# Patient Record
Sex: Female | Born: 1960 | Race: Black or African American | Hispanic: No | Marital: Married | Smoking: Never smoker
Health system: Southern US, Community
[De-identification: ages and names within clinical notes are randomized; demographics above are authoritative.]

## PROBLEM LIST (undated history)

## (undated) DIAGNOSIS — D649 Anemia, unspecified: Secondary | ICD-10-CM

## (undated) DIAGNOSIS — R7303 Prediabetes: Secondary | ICD-10-CM

## (undated) DIAGNOSIS — I10 Essential (primary) hypertension: Secondary | ICD-10-CM

## (undated) HISTORY — DX: Anemia, unspecified: D64.9

## (undated) HISTORY — DX: Essential (primary) hypertension: I10

---

## 1999-11-24 HISTORY — PX: OTHER SURGICAL HISTORY: SHX169

## 1999-11-24 HISTORY — PX: CYST EXCISION: SHX5701

## 2005-02-22 ENCOUNTER — Emergency Department: Payer: Self-pay | Admitting: General Practice

## 2005-04-22 ENCOUNTER — Ambulatory Visit: Payer: Self-pay | Admitting: Obstetrics and Gynecology

## 2006-04-30 ENCOUNTER — Ambulatory Visit: Payer: Self-pay | Admitting: Obstetrics and Gynecology

## 2007-07-01 ENCOUNTER — Ambulatory Visit: Payer: Self-pay | Admitting: Obstetrics and Gynecology

## 2008-07-18 ENCOUNTER — Ambulatory Visit: Payer: Self-pay | Admitting: Obstetrics and Gynecology

## 2008-09-06 DIAGNOSIS — R519 Headache, unspecified: Secondary | ICD-10-CM | POA: Insufficient documentation

## 2008-09-08 ENCOUNTER — Ambulatory Visit: Payer: Self-pay | Admitting: Neurology

## 2009-09-17 ENCOUNTER — Ambulatory Visit: Payer: Self-pay | Admitting: Obstetrics and Gynecology

## 2010-04-16 DIAGNOSIS — R928 Other abnormal and inconclusive findings on diagnostic imaging of breast: Secondary | ICD-10-CM | POA: Insufficient documentation

## 2010-04-28 ENCOUNTER — Ambulatory Visit: Payer: Self-pay | Admitting: Family Medicine

## 2010-09-18 ENCOUNTER — Ambulatory Visit: Payer: Self-pay | Admitting: Obstetrics and Gynecology

## 2011-04-07 ENCOUNTER — Emergency Department: Payer: Self-pay | Admitting: Emergency Medicine

## 2011-09-22 ENCOUNTER — Ambulatory Visit: Payer: Self-pay | Admitting: Obstetrics and Gynecology

## 2012-06-24 ENCOUNTER — Emergency Department: Payer: Self-pay | Admitting: Emergency Medicine

## 2012-06-24 LAB — CBC
MCH: 33.6 pg (ref 26.0–34.0)
MCHC: 35.6 g/dL (ref 32.0–36.0)
MCV: 95 fL (ref 80–100)
Platelet: 218 10*3/uL (ref 150–440)
RDW: 12.3 % (ref 11.5–14.5)

## 2012-06-24 LAB — COMPREHENSIVE METABOLIC PANEL
Anion Gap: 6 — ABNORMAL LOW (ref 7–16)
Bilirubin,Total: 0.3 mg/dL (ref 0.2–1.0)
Chloride: 105 mmol/L (ref 98–107)
Co2: 29 mmol/L (ref 21–32)
Creatinine: 0.86 mg/dL (ref 0.60–1.30)
EGFR (African American): >60
EGFR (Non-African Amer.): >60
Glucose: 84 mg/dL (ref 65–99)
Osmolality: 278 (ref 275–301)
SGOT(AST): 40 U/L — ABNORMAL HIGH (ref 15–37)
Sodium: 140 mmol/L (ref 136–145)

## 2012-09-22 ENCOUNTER — Ambulatory Visit: Payer: Self-pay | Admitting: Obstetrics and Gynecology

## 2012-12-30 ENCOUNTER — Ambulatory Visit: Payer: Self-pay | Admitting: Gastroenterology

## 2012-12-30 LAB — HM COLONOSCOPY: HM COLON: NORMAL

## 2013-03-10 ENCOUNTER — Ambulatory Visit: Payer: Self-pay | Admitting: Family Medicine

## 2013-09-26 ENCOUNTER — Ambulatory Visit: Payer: Self-pay | Admitting: Obstetrics and Gynecology

## 2014-06-16 ENCOUNTER — Inpatient Hospital Stay: Payer: Self-pay | Admitting: Internal Medicine

## 2014-06-16 LAB — COMPREHENSIVE METABOLIC PANEL
ALK PHOS: 75 U/L
AST: 23 U/L (ref 15–37)
Albumin: 3.4 g/dL (ref 3.4–5.0)
Anion Gap: 6 — ABNORMAL LOW (ref 7–16)
BILIRUBIN TOTAL: 0.3 mg/dL (ref 0.2–1.0)
BUN: 12 mg/dL (ref 7–18)
CALCIUM: 9.2 mg/dL (ref 8.5–10.1)
CHLORIDE: 105 mmol/L (ref 98–107)
CO2: 30 mmol/L (ref 21–32)
CREATININE: 1.15 mg/dL (ref 0.60–1.30)
GFR CALC NON AF AMER: 54 — AB
Glucose: 87 mg/dL (ref 65–99)
Osmolality: 280 (ref 275–301)
Potassium: 3.5 mmol/L (ref 3.5–5.1)
SGPT (ALT): 22 U/L
Sodium: 141 mmol/L (ref 136–145)
TOTAL PROTEIN: 7.6 g/dL (ref 6.4–8.2)

## 2014-06-16 LAB — CBC
HCT: 37.6 % (ref 35.0–47.0)
HGB: 12.4 g/dL (ref 12.0–16.0)
MCH: 31.5 pg (ref 26.0–34.0)
MCHC: 33 g/dL (ref 32.0–36.0)
MCV: 95 fL (ref 80–100)
PLATELETS: 209 10*3/uL (ref 150–440)
RBC: 3.94 10*6/uL (ref 3.80–5.20)
RDW: 12.4 % (ref 11.5–14.5)
WBC: 5.4 10*3/uL (ref 3.6–11.0)

## 2014-06-16 LAB — CK TOTAL AND CKMB (NOT AT ARMC)
CK, TOTAL: 89 U/L
CK, Total: 108 U/L
CK-MB: <0.5 ng/mL — ABNORMAL LOW (ref 0.5–3.6)
CK-MB: 0.6 ng/mL (ref 0.5–3.6)

## 2014-06-16 LAB — APTT: Activated PTT: 28.6 secs (ref 23.6–35.9)

## 2014-06-16 LAB — TROPONIN I
TROPONIN-I: 0.09 ng/mL — AB
TROPONIN-I: 0.1 ng/mL — AB
TROPONIN-I: 0.09 ng/mL — AB

## 2014-06-16 LAB — PROTIME-INR
INR: 1
PROTHROMBIN TIME: 13 s (ref 11.5–14.7)

## 2014-06-17 LAB — BASIC METABOLIC PANEL
Anion Gap: 7 (ref 7–16)
BUN: 14 mg/dL (ref 7–18)
CO2: 29 mmol/L (ref 21–32)
CREATININE: 1.22 mg/dL (ref 0.60–1.30)
Calcium, Total: 8.8 mg/dL (ref 8.5–10.1)
Chloride: 104 mmol/L (ref 98–107)
EGFR (African American): 59 — ABNORMAL LOW
EGFR (Non-African Amer.): 51 — ABNORMAL LOW
Glucose: 91 mg/dL (ref 65–99)
Osmolality: 279 (ref 275–301)
Potassium: 3.6 mmol/L (ref 3.5–5.1)
Sodium: 140 mmol/L (ref 136–145)

## 2014-06-17 LAB — CBC WITH DIFFERENTIAL/PLATELET
Basophil #: 0 10*3/uL (ref 0.0–0.1)
Basophil %: 0.4 %
EOS ABS: 0.1 10*3/uL (ref 0.0–0.7)
EOS PCT: 1.8 %
HCT: 35.7 % (ref 35.0–47.0)
HGB: 12.3 g/dL (ref 12.0–16.0)
LYMPHS PCT: 36.9 %
Lymphocyte #: 3 10*3/uL (ref 1.0–3.6)
MCH: 32.6 pg (ref 26.0–34.0)
MCHC: 34.5 g/dL (ref 32.0–36.0)
MCV: 94 fL (ref 80–100)
MONO ABS: 0.6 x10 3/mm (ref 0.2–0.9)
Monocyte %: 7.5 %
Neutrophil #: 4.4 10*3/uL (ref 1.4–6.5)
Neutrophil %: 53.4 %
Platelet: 212 10*3/uL (ref 150–440)
RBC: 3.79 10*6/uL — AB (ref 3.80–5.20)
RDW: 12.5 % (ref 11.5–14.5)
WBC: 8.2 10*3/uL (ref 3.6–11.0)

## 2014-06-17 LAB — CK TOTAL AND CKMB (NOT AT ARMC)
CK, Total: 86 U/L
CK-MB: <0.5 ng/mL — ABNORMAL LOW (ref 0.5–3.6)

## 2014-06-17 LAB — TROPONIN I: TROPONIN-I: 0.09 ng/mL — AB

## 2014-10-08 ENCOUNTER — Ambulatory Visit: Payer: Self-pay | Admitting: Obstetrics and Gynecology

## 2014-10-08 LAB — HM MAMMOGRAPHY

## 2015-03-16 NOTE — Discharge Summary (Signed)
PATIENT NAME:  Caroline Bowen, Caroline Bowen MR#:  [id number] DATE OF BIRTH:  05-21-1961  DATE OF ADMISSION:  06/16/2014 DATE OF DISCHARGE:  06/18/2014  PRESENTING COMPLAINT: Chest pain.   DISCHARGE DIAGNOSES:  1. Chest pain, appeared atypical, could be due to gastroesophageal reflux disease, resolved.  2. Cardiac catheterization with normal coronaries.   PROCEDURES: Cardiac catheterization with normal coronaries and normal ejection fraction.   CODE STATUS: Full Code.   MEDICATIONS:  1. Omeprazole 20 mg daily.  2. Metoprolol 25 mg daily.  3. Hydroxyzine hydrochloride 25 mg 1 tablet every 4 to 6 hours as needed.  4. Hydrochlorothiazide/triamterene 25/37.5 half-a-tablet daily.   FOLLOW-UP:  1. With Dr. Elease HashimotoMaloney in 1 to 2 weeks. 2. Follow up with Dr. Adrian BlackwaterShaukat Khan in 2 weeks.   LABORATORY DATA: Echocardiogram/Doppler showed ejection fraction of 55% to 60%. Normal wall motion, decreased left ventricular internal cavity size, normal right ventricular size and systolic function, left atrium and right atrium are normal in size and structure.   CBC within normal limits. Basic metabolic panel within normal limits. Troponin is 0.09, 0.09, and 0.10.   CONSULTS: Cardiology consultation by Dr. Adrian BlackwaterShaukat Khan.   BRIEF SUMMARY OF HOSPITAL COURSE: [name] is a 54 year old African American female with history of hypertension, comes in with chest pain and elevated mild troponin. She was admitted with:   1. Chest pain with mild elevated troponin. Admission Diagnosis is non-STEMI; however, it was ruled out after she was found to have normal coronaries on cardiac catheterization. The patient was chest pain-free and hemodynamically stable, okay from cardiology standpoint to be discharged. She was discharged on Toprol-XL and triamterene/hydrochlorothiazide along with a baby aspirin.  2. Hypertension. Continued hydrochlorothiazide, added Toprol-XL.  3. Possible GERD, advised over-the-counter Prilosec.   Hospital stay  otherwise remained stable.   TIME SPENT: 40 minutes.    ____________________________ Wylie HailSona A. Allena KatzPatel, MD sap:jr D: 06/19/2014 15:05:47 ET T: 06/19/2014 16:16:31 ET JOB#: [id number]  cc: Glee Lashomb A. Allena KatzPatel, MD, <Dictator> Leo GrosserNancy J. Maloney, MD Willow OraSONA A Braylei Totino MD ELECTRONICALLY SIGNED 07/04/2014 11:33

## 2015-03-16 NOTE — H&P (Signed)
PATIENT NAME:  Caroline Bowen, Caroline Bowen MR#:  [id number] DATE OF BIRTH:  [birth year]  DATE OF ADMISSION:  06/16/2014  REFERRING PHYSICIAN: Dr. Dierdre Searles   FAMILY PHYSICIAN: Leo Grosser, MD  REASON FOR ADMISSION: Chest pain with elevated troponin.   HISTORY OF PRESENT ILLNESS: The patient is a 54 year old female with a history of hypertension, who presents to the Emergency Room with burning sensation in her chest and neck. Some left-sided headache. Denies previous cardiac history. Pain was 7 out of 10 at its worst. No radiation of the pain. It was nonexertional. She woke up with the discomfort this morning. Denies any associated nausea, vomiting, diaphoresis or shortness of breath. No palpitations. In the Emergency Room, the patient was noted to be mildly hypertensive with T wave inversion noted on EKG. Her troponin is mildly elevated at 0.09. She is now admitted for further evaluation.   PAST MEDICAL HISTORY:  1. Benign hypertension.  2. Chronic pruritus.  3. Menopausal syndrome.   MEDICATIONS:  1. Triamterene/hydrochlorothiazide 37.5/25 mg 1/2-tablet p.o. daily.  2. Hydroxyzine 25 mg p.o. q.6 hours p.r.n. itching.  3. CombiPatch 1 topically 2 times per week.   ALLERGIES: No known drug allergies.   SOCIAL HISTORY: The patient is married with a 79 year old son. Works for [employer] as a Engineer, materials. No history of alcohol or tobacco abuse.   FAMILY HISTORY: Positive for congestive heart failure in her father and coronary artery disease in her paternal grandfather. Also positive for hypertension. Negative for colon or breast cancer.   REVIEW OF SYSTEMS:  CONSTITUTIONAL: No fever or change in weight.  EYES: No blurred or double vision. No glaucoma.  ENT: No tinnitus or hearing loss. No nasal discharge or bleeding. No difficulty swallowing.  RESPIRATORY: No cough or wheezing. Denies hemoptysis.  CARDIOVASCULAR: No orthopnea or palpitations. No syncope.  GASTROINTESTINAL: No  nausea, vomiting or diarrhea. No abdominal pain. No change in bowel habits.  GENITOURINARY: No dysuria or hematuria. No incontinence.  ENDOCRINE: No polyuria or polydipsia. No heat or cold intolerance.  HEMATOLOGIC: The patient denies anemia, easy bruising or bleeding.  LYMPHATIC: No swollen glands.  MUSCULOSKELETAL: The patient denies pain in her neck, back, shoulders, knees or hips. No gout.  NEUROLOGIC: No numbness or migraines. Denies stroke or seizures.  PSYCHIATRIC: The patient denies anxiety, insomnia or depression.   PHYSICAL EXAMINATION:  GENERAL: The patient is in no acute distress.  VITAL SIGNS: Currently remarkable for a blood pressure of 159/98, heart rate 72, respiratory rate 20, temperature 98.2, saturation 98% on room air.  HEENT: Normocephalic, atraumatic. Pupils equally round and reactive to light and accommodation. Extraocular movements are intact. Sclerae are nonicteric. Conjunctivae are clear. Oropharynx is clear.  NECK: Supple without JVD. No adenopathy or thyromegaly is noted.  LUNGS: Clear to auscultation and percussion without wheezes, rales or rhonchi. No dullness. Respiratory effort is normal.  CARDIAC: Regular rate and rhythm with a normal S1 and S2. No significant rubs, murmurs or gallops. PMI is nondisplaced. Chest wall is nontender.  ABDOMEN: Soft, nontender, with normoactive bowel sounds. No organomegaly or masses were appreciated. No hernias or bruits were noted.  EXTREMITIES: Without clubbing, cyanosis, edema. Pulses were 2+ bilaterally.  SKIN: Warm and dry, without rash or lesions.  NEUROLOGIC: Cranial nerves II through XII grossly intact. Deep tendon reflexes are symmetric. Motor and sensory exam nonfocal.  PSYCHIATRIC: Revealed a patient who was alert and oriented to person, place and time. She was cooperative and used good judgment.   LABORATORY  DATA: EKG revealed sinus rhythm with T wave inversion inferolaterally. Chest x-ray was unremarkable. Her troponin  was 0.09. Glucose 87 with a BUN of 12, creatinine 1.15 and a sodium of 141 with a potassium of 3.5 and a GFR of greater than 60. Her white count was 5.4 with a hemoglobin of 12.4.   ASSESSMENT:  1. Atypical chest pain.  2. Elevated troponin.  3. Abnormal electrocardiogram.  4. Benign hypertension.   PLAN: The patient will be observed on telemetry with aspirin and subcutaneous Lovenox. Will follow serial cardiac enzymes and obtain an echocardiogram. Will obtain a cardiology consult because of the elevation of her troponin. Follow up routine labs in the morning. Will monitor her blood pressure closely. Further treatment and evaluation will depend upon the patient's progress.   TOTAL TIME SPENT ON THIS PATIENT: 45 minutes.   ____________________________ Duane LopeJeffrey D. Judithann SheenSparks, MD jds:lb D: 06/16/2014 12:55:46 ET T: 06/16/2014 13:24:33 ET JOB#: [id number]  cc: Duane LopeJeffrey D. Judithann SheenSparks, MD, <Dictator> Leo GrosserNancy J. Maloney, MD Makalia Bare Rodena Medin Chauncey Bruno MD ELECTRONICALLY SIGNED 06/17/2014 10:59

## 2015-03-16 NOTE — Consult Note (Signed)
PATIENT NAME:  Caroline Bowen, DIANE B MR#:  [id number] DATE OF BIRTH:  Apr 11, 1961  DATE OF CONSULTATION:  06/16/2014  REFERRING PHYSICIAN:   CONSULTING PHYSICIAN:  Laurier NancyShaukat A. Klarissa Mcilvain, MD  INDICATION FOR CONSULTATION: Chest pain.   HISTORY OF PRESENT ILLNESS: This is a 54 year old African American female with a past medical history of hypertension who presented to the Emergency Room with left-sided pressure-type chest pain associated with shortness of breath and diaphoresis and headache. She had 7/10 chest pain lasting 45 minutes. Her EKG was abnormal along with a mildly elevated troponin, thus I was asked to evaluate the patient. She had, along with chest pain, left-sided headaches. Chest pain is gone right now.   PAST MEDICAL HISTORY:  1.  History of hypertension.  2.  History of menopause. 3.  History of chronic pruritus.   MEDICATIONS: Triamterene/hydrochlorothiazide 37.5/25 half tablet once a day, CombiPatch, and hydroxyzine.   ALLERGIES: NONE.   SOCIAL HISTORY: No history of EtOH abuse or smoking.   FAMILY HISTORY: Positive for coronary artery disease and CHF.   PHYSICAL EXAMINATION:  GENERAL: She is alert and oriented x 3 in no acute distress.  VITAL SIGNS: Stable.  NECK: No JVD.  LUNGS: Clear.  HEART: Regular rate and rhythm. Normal S1, S2. No audible murmur.  ABDOMEN: Soft, nontender, positive bowel sounds.  EXTREMITIES: No pedal edema.  NEUROLOGIC: The patient appears to be intact.   DIAGNOSTIC STUDIES: Her EKG shows normal sinus rhythm, 65 beats per minute, T wave inversion in to V3-V6 and leads II, III, aVF.  Chest pain is gone now.   ASSESSMENT AND PLAN: The patient probably has features of non-ST segment elevation  myocardial infarction/unstable angina with risk factors of hypertension, menopause, and family history of coronary artery disease, abnormal EKG, and mildly elevated troponin. We will start the patient on Plavix with loading dose of 300 and then 75 once a day, and will  set up for cardiac catheterization Monday.   Thank you very much for the referral.    ____________________________ Laurier NancyShaukat A. Dustine Bertini, MD sak:ts D: 06/16/2014 14:23:24 ET T: 06/16/2014 14:39:25 ET JOB#: [id number]  cc: Laurier NancyShaukat A. Graiden Henes, MD, <Dictator> Laurier NancySHAUKAT A Virginia Francisco MD ELECTRONICALLY SIGNED 06/28/2014 12:02

## 2015-05-24 DIAGNOSIS — D649 Anemia, unspecified: Secondary | ICD-10-CM | POA: Insufficient documentation

## 2015-05-24 DIAGNOSIS — Z862 Personal history of diseases of the blood and blood-forming organs and certain disorders involving the immune mechanism: Secondary | ICD-10-CM | POA: Insufficient documentation

## 2015-05-24 DIAGNOSIS — R609 Edema, unspecified: Secondary | ICD-10-CM | POA: Insufficient documentation

## 2015-06-05 ENCOUNTER — Ambulatory Visit (INDEPENDENT_AMBULATORY_CARE_PROVIDER_SITE_OTHER): Payer: BLUE CROSS/BLUE SHIELD | Admitting: Family Medicine

## 2015-06-05 ENCOUNTER — Encounter: Payer: Self-pay | Admitting: Family Medicine

## 2015-06-05 VITALS — BP 116/70 | HR 88 | Temp 98.6°F | Resp 16 | Ht 67.0 in | Wt 173.0 lb

## 2015-06-05 DIAGNOSIS — I10 Essential (primary) hypertension: Secondary | ICD-10-CM

## 2015-06-05 DIAGNOSIS — N959 Unspecified menopausal and perimenopausal disorder: Secondary | ICD-10-CM | POA: Diagnosis not present

## 2015-06-05 MED ORDER — ESTRADIOL-NORETHINDRONE ACET 0.05-0.14 MG/DAY TD PTTW: 1.0000 | MEDICATED_PATCH | TRANSDERMAL | Status: DC

## 2015-06-05 MED ORDER — TRIAMTERENE-HCTZ 37.5-25 MG PO TABS: 1.0000 | ORAL_TABLET | Freq: Every day | ORAL | Status: DC

## 2015-06-05 NOTE — Progress Notes (Signed)
Subjective:    Patient ID: Caroline Bowen, female    DOB: 05/12/1961, 54 y.o.   MRN: [id number]  Hypertension This is a chronic problem. The problem is controlled (Pt currently taking Maxzide 37.5-25 mg). Pertinent negatives include no anxiety, blurred vision, chest pain, headaches, malaise/fatigue, neck pain, orthopnea, palpitations, peripheral edema, shortness of breath or sweats. The current treatment provides moderate improvement. There are no compliance problems.   Pt is not checking BP outside of the office.  Menopause Symptoms Pt is requesting refill on Combipatch. Is helping her symptoms.   Mom has been sick. Did finish chemo. Is doing better. Unclear of prognosis.    Review of Systems  Constitutional: Negative.  Negative for malaise/fatigue.  Eyes: Negative for blurred vision.  Respiratory: Negative for shortness of breath.   Cardiovascular: Negative for chest pain, palpitations and orthopnea.  Musculoskeletal: Negative for neck pain.  Neurological: Negative for headaches.   BP 116/70 mmHg  Pulse 88  Temp(Src) 98.6 F (37 C) (Oral)  Resp 16  Ht 5\' 7"  (1.702 m)  Wt 173 lb (78.472 kg)  BMI 27.09 kg/m2   Patient Active Problem List   Diagnosis Date Noted  . Absolute anemia 05/24/2015  . Accumulation of fluid in tissues 05/24/2015  . Abnormal finding on breast imaging 04/16/2010  . Benign hypertension 09/06/2008  . Headache disorder 09/06/2008   No past medical history on file. Current Outpatient Prescriptions on File Prior to Visit  Medication Sig  . estradiol-norethindrone (COMBIPATCH) 0.05-0.14 MG/DAY Place onto the skin.  Marland Kitchen. triamterene-hydrochlorothiazide (MAXZIDE-25) 37.5-25 MG per tablet Take by mouth.   No current facility-administered medications on file prior to visit.   Allergies  Allergen Reactions  . Metoprolol Swelling   Past Surgical History  Procedure Laterality Date  . Cyst excision  2001    wrist   History   Social History  . Marital  Status: Married    Spouse Name: N/A  . Number of Children: N/A  . Years of Education: N/A   Occupational History  . Not on file.   Social History Main Topics  . Smoking status: Never Smoker   . Smokeless tobacco: Never Used  . Alcohol Use: No  . Drug Use: No  . Sexual Activity: Not on file   Other Topics Concern  . Not on file   Social History Narrative   Family History  Problem Relation Age of Onset  . Hypertension Mother   . Hypertension Father   . Diabetes Father   . Kidney disease Father   . Prostate cancer Father   . Heart failure Father   . Diabetes Sister   . Hypertension Sister   . Hypertension Brother   . Hypertension Sister   . Pancreatic cancer Brother        Objective:   Physical Exam  Constitutional: She is oriented to person, place, and time. She appears well-developed and well-nourished.  Neurological: She is alert and oriented to person, place, and time.    BP 116/70 mmHg  Pulse 88  Temp(Src) 98.6 F (37 C) (Oral)  Resp 16  Ht 5\' 7"  (1.702 m)  Wt 173 lb (78.472 kg)  BMI 27.09 kg/m2      Assessment & Plan:  1. Benign hypertension Stable. Continue to take medication as prescribed.   - CBC with Differential/Platelet - Comprehensive metabolic panel - TSH - triamterene-hydrochlorothiazide (MAXZIDE-25) 37.5-25 MG per tablet; Take 1 tablet by mouth daily. Patient currently take 1/2 day.  Dispense: 30 tablet;  Refill: 5  2. Menopausal disorder Symptoms controlled. Continue current medication.  No side effects noted.   - estradiol-norethindrone (COMBIPATCH) 0.05-0.14 MG/DAY; Place 1 patch onto the skin 2 (two) times a week.  Dispense: 8 patch; Refill: 5  3. For mood- Dealing with illness of her mother.   Currently both are doing ok. Will address further as needed.  Lorie Phenix, MD

## 2015-06-07 ENCOUNTER — Telehealth: Payer: Self-pay

## 2015-06-07 LAB — COMPREHENSIVE METABOLIC PANEL
A/G RATIO: 1.8 (ref 1.1–2.5)
ALBUMIN: 4.4 g/dL (ref 3.5–5.5)
ALT: 13 IU/L (ref 0–32)
AST: 19 IU/L (ref 0–40)
Alkaline Phosphatase: 73 IU/L (ref 39–117)
BUN / CREAT RATIO: 12 (ref 9–23)
BUN: 12 mg/dL (ref 6–24)
Bilirubin Total: 0.3 mg/dL (ref 0.0–1.2)
CALCIUM: 9.8 mg/dL (ref 8.7–10.2)
CO2: 25 mmol/L (ref 18–29)
CREATININE: 1.03 mg/dL — AB (ref 0.57–1.00)
Chloride: 101 mmol/L (ref 97–108)
GFR calc non Af Amer: 62 mL/min/{1.73_m2} (ref >59)
GFR, EST AFRICAN AMERICAN: 71 mL/min/{1.73_m2} (ref >59)
Globulin, Total: 2.5 g/dL (ref 1.5–4.5)
Glucose: 91 mg/dL (ref 65–99)
POTASSIUM: 4.3 mmol/L (ref 3.5–5.2)
Sodium: 141 mmol/L (ref 134–144)
Total Protein: 6.9 g/dL (ref 6.0–8.5)

## 2015-06-07 LAB — CBC WITH DIFFERENTIAL/PLATELET
Basophils Absolute: 0 10*3/uL (ref 0.0–0.2)
Basos: 0 %
EOS (ABSOLUTE): 0.1 10*3/uL (ref 0.0–0.4)
Eos: 3 %
HEMATOCRIT: 37.8 % (ref 34.0–46.6)
HEMOGLOBIN: 12.8 g/dL (ref 11.1–15.9)
Immature Grans (Abs): 0 10*3/uL (ref 0.0–0.1)
Immature Granulocytes: 0 %
LYMPHS ABS: 2.2 10*3/uL (ref 0.7–3.1)
Lymphs: 43 %
MCH: 31.8 pg (ref 26.6–33.0)
MCHC: 33.9 g/dL (ref 31.5–35.7)
MCV: 94 fL (ref 79–97)
MONOCYTES: 9 %
MONOS ABS: 0.5 10*3/uL (ref 0.1–0.9)
Neutrophils Absolute: 2.3 10*3/uL (ref 1.4–7.0)
Neutrophils: 45 %
PLATELETS: 228 10*3/uL (ref 150–379)
RBC: 4.02 x10E6/uL (ref 3.77–5.28)
RDW: 13.4 % (ref 12.3–15.4)
WBC: 5.1 10*3/uL (ref 3.4–10.8)

## 2015-06-07 LAB — TSH: TSH: 2.43 u[IU]/mL (ref 0.450–4.500)

## 2015-06-07 NOTE — Telephone Encounter (Signed)
Pt advised as directed below.   Thanks,   -Barett Whidbee  

## 2015-06-07 NOTE — Telephone Encounter (Signed)
-----   Message from Lorie PhenixNancy Maloney, MD sent at 06/07/2015  8:38 AM EDT ----- Labs stable. Please notify patient. Thanks.

## 2015-06-28 ENCOUNTER — Other Ambulatory Visit: Payer: Self-pay | Admitting: Family Medicine

## 2015-06-28 DIAGNOSIS — I10 Essential (primary) hypertension: Secondary | ICD-10-CM

## 2015-09-09 ENCOUNTER — Ambulatory Visit (INDEPENDENT_AMBULATORY_CARE_PROVIDER_SITE_OTHER): Payer: BLUE CROSS/BLUE SHIELD | Admitting: Family Medicine

## 2015-09-09 ENCOUNTER — Encounter: Payer: Self-pay | Admitting: Family Medicine

## 2015-09-09 VITALS — BP 120/66 | HR 76 | Temp 97.9°F | Resp 16 | Ht 67.0 in | Wt 175.0 lb

## 2015-09-09 DIAGNOSIS — Z0000 Encounter for general adult medical examination without abnormal findings: Secondary | ICD-10-CM

## 2015-09-09 DIAGNOSIS — Z23 Encounter for immunization: Secondary | ICD-10-CM

## 2015-09-09 DIAGNOSIS — Z1239 Encounter for other screening for malignant neoplasm of breast: Secondary | ICD-10-CM

## 2015-09-09 LAB — POCT URINALYSIS DIPSTICK
BILIRUBIN UA: NEGATIVE
GLUCOSE UA: NEGATIVE
KETONES UA: NEGATIVE
Leukocytes, UA: NEGATIVE
NITRITE UA: NEGATIVE
Protein, UA: NEGATIVE
RBC UA: NEGATIVE
SPEC GRAV UA: 1.02
Urobilinogen, UA: 0.2
pH, UA: 6

## 2015-09-09 NOTE — Progress Notes (Signed)
Patient ID: Caroline Bowen, female   DOB: 10/17/1961, 54 y.o.   MRN: [id number]        Patient: Caroline SalkDiane B Mcgreal, Female    DOB: 10/17/1961, 54 y.o.   MRN: [id number] Visit Date: 09/09/2015  Today's Provider: Lorie PhenixNancy Tyrika Newman, MD   Chief Complaint  Patient presents with  . Annual Exam   Subjective:    Annual physical exam Caroline SalkDiane B Kistner is a 54 y.o. female who presents today for health maintenance and complete physical. She feels well. She reports exercising none. She reports she is sleeping well.  09/29/13 CPE 10/08/14 Mammogram-BI-RADS 1 12/30/12 Colon-Normal 04/19/13 Pap at GYN   Lab Results  Component Value Date   WBC 5.1 06/06/2015   HGB 12.3 06/17/2014   HCT 37.8 06/06/2015   PLT 212 06/17/2014   GLUCOSE 91 06/06/2015   ALT 13 06/06/2015   AST 19 06/06/2015   NA 141 06/06/2015   K 4.3 06/06/2015   CL 101 06/06/2015   CREATININE 1.03* 06/06/2015   BUN 12 06/06/2015   CO2 25 06/06/2015   TSH 2.430 06/06/2015   INR 1.0 06/16/2014    -----------------------------------------------------------------   Review of Systems  Constitutional: Negative.   HENT: Negative.   Eyes: Negative.   Respiratory: Negative.   Cardiovascular: Negative.   Gastrointestinal: Negative.   Endocrine: Negative.   Genitourinary: Negative.   Musculoskeletal: Negative.   Skin: Negative.   Allergic/Immunologic: Negative.   Neurological: Negative.   Hematological: Negative.   Psychiatric/Behavioral: Negative.     Social History She  reports that she has never smoked. She has never used smokeless tobacco. She reports that she does not drink alcohol or use illicit drugs. Social History   Social History  . Marital Status: Married    Spouse Name: N/A  . Number of Children: N/A  . Years of Education: N/A   Social History Main Topics  . Smoking status: Never Smoker   . Smokeless tobacco: Never Used  . Alcohol Use: No  . Drug Use: No  . Sexual Activity: Not Asked   Other Topics  Concern  . None   Social History Narrative    Patient Active Problem List   Diagnosis Date Noted  . Menopausal disorder 06/05/2015  . Absolute anemia 05/24/2015  . Accumulation of fluid in tissues 05/24/2015  . Abnormal finding on breast imaging 04/16/2010  . Benign hypertension 09/06/2008  . Headache disorder 09/06/2008    Past Surgical History  Procedure Laterality Date  . Cyst excision  2001    wrist  . Wrist surgery  2001    Family History  Family Status  Relation Status Death Age  . Mother Alive   . Father Alive   . Sister Alive   . Brother Alive   . Sister Alive   . Brother Deceased   . Brother Alive    Her family history includes Cancer in her mother; Diabetes in her father and sister; Heart failure in her father; Hypertension in her brother, father, mother, sister, and sister; Kidney disease in her father; Pancreatic cancer in her brother; Prostate cancer in her father.    Allergies  Allergen Reactions  . Metoprolol Swelling    Previous Medications   ESTRADIOL-NORETHINDRONE (COMBIPATCH) 0.05-0.14 MG/DAY    Place 1 patch onto the skin 2 (two) times a week.   TRIAMTERENE-HYDROCHLOROTHIAZIDE (MAXZIDE-25) 37.5-25 MG PER TABLET    TAKE 1 TABLET BY MOUTH EVERY DAY    Patient Care Team: Lorie PhenixNancy Leiana Rund, MD as PCP -  General (Family Medicine)     Objective:   Vitals: BP 120/66 mmHg  Pulse 76  Temp(Src) 97.9 F (36.6 C) (Oral)  Resp 16  Ht  (1.702 m)  Wt 175 lb (79.379 kg)  BMI 27.40 kg/m2   Physical Exam  Constitutional: She is oriented to person, place, and time. She appears well-developed and well-nourished.  HENT:  Head: Normocephalic and atraumatic.  Right Ear: Tympanic membrane, external ear and ear canal normal.  Left Ear: Tympanic membrane, external ear and ear canal normal.  Nose: Nose normal.  Mouth/Throat: Uvula is midline, oropharynx is clear and moist and mucous membranes are normal.  Eyes: Conjunctivae, EOM and lids are normal.  Pupils are equal, round, and reactive to light.  Neck: Trachea normal and normal range of motion. Neck supple. Carotid bruit is not present. No thyroid mass and no thyromegaly present.  Cardiovascular: Normal rate, regular rhythm and normal heart sounds.   Pulmonary/Chest: Effort normal and breath sounds normal.  Abdominal: Soft. Normal appearance and bowel sounds are normal. There is no hepatosplenomegaly. There is no tenderness.  Genitourinary: No breast swelling, tenderness or discharge.  Musculoskeletal: Normal range of motion.  Lymphadenopathy:    She has no cervical adenopathy.    She has no axillary adenopathy.  Neurological: She is alert and oriented to person, place, and time. She has normal strength. No cranial nerve deficit.  Skin: Skin is warm, dry and intact.  Psychiatric: She has a normal mood and affect. Her speech is normal and behavior is normal. Judgment and thought content normal. Cognition and memory are normal.     Depression Screen PHQ 2/9 Scores 09/09/2015  PHQ - 2 Score 0      Assessment & Plan:     Routine Health Maintenance and Physical Exam  Exercise Activities and Dietary recommendations Goals    . Exercise 150 minutes per week (moderate activity)       There is no immunization history for the selected administration types on file for this patient.  Health Maintenance  Topic Date Due  . Hepatitis C Screening  1961-07-21  . HIV Screening  04/18/1976  . TETANUS/TDAP  04/18/1980  . PAP SMEAR  04/18/1982  . MAMMOGRAM  04/19/2011  . COLONOSCOPY  04/19/2011  . INFLUENZA VACCINE  06/24/2015      1. Annual physical exam Stable. Eat healthy and exercise. - POCT urinalysis dipstick  2. Need for influenza vaccination Given today.  - Flu Vaccine QUAD 36+ mos IM  3. Breast cancer screening Will schedule. - MM DIGITAL SCREENING BILATERAL; Future  Patient was seen and examined by Leo Grosser, MD, and note scribed by Rondel Baton, CMA.    I have reviewed the document for accuracy and completeness and I agree with above. Leo Grosser, MD   Lorie Phenix, MD

## 2015-09-09 NOTE — Patient Instructions (Signed)
Please call the [facility] at [facility] to schedule this at [phone]   

## 2015-10-10 ENCOUNTER — Ambulatory Visit
Admission: RE | Admit: 2015-10-10 | Discharge: 2015-10-10 | Disposition: A | Discharge status: Home / Self Care | Payer: BLUE CROSS/BLUE SHIELD | Source: Ambulatory Visit | Attending: Family Medicine | Admitting: Family Medicine

## 2015-10-10 DIAGNOSIS — Z1231 Encounter for screening mammogram for malignant neoplasm of breast: Secondary | ICD-10-CM | POA: Diagnosis present

## 2015-10-10 DIAGNOSIS — Z1239 Encounter for other screening for malignant neoplasm of breast: Secondary | ICD-10-CM

## 2015-10-11 ENCOUNTER — Other Ambulatory Visit: Payer: Self-pay | Admitting: Family Medicine

## 2015-10-11 DIAGNOSIS — R928 Other abnormal and inconclusive findings on diagnostic imaging of breast: Secondary | ICD-10-CM

## 2015-10-25 ENCOUNTER — Ambulatory Visit
Admission: RE | Admit: 2015-10-25 | Discharge: 2015-10-25 | Disposition: A | Discharge status: Home / Self Care | Payer: BLUE CROSS/BLUE SHIELD | Source: Ambulatory Visit | Attending: Family Medicine | Admitting: Family Medicine

## 2015-10-25 DIAGNOSIS — R928 Other abnormal and inconclusive findings on diagnostic imaging of breast: Secondary | ICD-10-CM

## 2016-01-15 ENCOUNTER — Other Ambulatory Visit: Payer: Self-pay | Admitting: Family Medicine

## 2016-02-21 ENCOUNTER — Ambulatory Visit (INDEPENDENT_AMBULATORY_CARE_PROVIDER_SITE_OTHER): Payer: BLUE CROSS/BLUE SHIELD | Admitting: Family Medicine

## 2016-02-21 ENCOUNTER — Encounter: Payer: Self-pay | Admitting: Family Medicine

## 2016-02-21 VITALS — BP 140/96 | HR 85 | Temp 98.3°F | Resp 16 | Ht 67.0 in | Wt 179.0 lb

## 2016-02-21 DIAGNOSIS — R059 Cough, unspecified: Secondary | ICD-10-CM

## 2016-02-21 DIAGNOSIS — J101 Influenza due to other identified influenza virus with other respiratory manifestations: Secondary | ICD-10-CM

## 2016-02-21 DIAGNOSIS — R05 Cough: Secondary | ICD-10-CM | POA: Diagnosis not present

## 2016-02-21 LAB — POCT INFLUENZA A/B
Influenza A, POC: POSITIVE — AB
Influenza B, POC: NEGATIVE

## 2016-02-21 MED ORDER — OSELTAMIVIR PHOSPHATE 75 MG PO CAPS: 75.0000 mg | ORAL_CAPSULE | Freq: Two times a day (BID) | ORAL | Status: AC

## 2016-02-21 NOTE — Progress Notes (Signed)
       Patient: Caroline Bowen Female    DOB: [birth year]   55 y.o.   MRN: [id number] Visit Date: 02/21/2016  Today's Provider: Mila Merryonald Jayan Raymundo, MD   Chief Complaint  Patient presents with  . Sinusitis   Subjective:    Sinusitis This is a new problem. The current episode started in the past 7 days (5 days). The problem is unchanged. There has been no fever. Associated symptoms include chills, congestion, coughing and sneezing. Pertinent negatives include no diaphoresis, ear pain, headaches, hoarse voice, neck pain, shortness of breath, sinus pressure, sore throat or swollen glands. Past treatments include oral decongestants (cold and flu). The treatment provided mild relief.   Had mostly recovered from initial symptoms until 2 days ago when started having cold chills, nasal congestion with fatigue. Scratchy throat, cough, and ear congestion (bilateral). Took 2 geltabs of cold medication today.    Allergies  Allergen Reactions  . Metoprolol Swelling   Previous Medications   COMBIPATCH 0.05-0.14 MG/DAY    PLACE 1 PATCH ONTO THE SKIN 2 (TWO) TIMES A WEEK.   TRIAMTERENE-HYDROCHLOROTHIAZIDE (MAXZIDE-25) 37.5-25 MG PER TABLET    TAKE 1 TABLET BY MOUTH EVERY DAY    Review of Systems  Constitutional: Positive for chills and fatigue. Negative for fever, diaphoresis and appetite change.  HENT: Positive for congestion, postnasal drip and sneezing. Negative for ear pain, hoarse voice, sinus pressure and sore throat.   Respiratory: Positive for cough. Negative for chest tightness and shortness of breath.   Cardiovascular: Negative for chest pain and palpitations.  Gastrointestinal: Negative for nausea, vomiting and abdominal pain.  Musculoskeletal: Negative for neck pain.  Neurological: Negative for dizziness, weakness and headaches.    Social History  Substance Use Topics  . Smoking status: Never Smoker   . Smokeless tobacco: Never Used  . Alcohol Use: No   Objective:   BP 140/96 mmHg   Pulse 85  Temp(Src) 98.3 F (36.8 C) (Oral)  Resp 16  Ht 5\' 7"  (1.702 m)  Wt 179 lb (81.194 kg)  BMI 28.03 kg/m2  SpO2 98%  Physical Exam  General Appearance:    Alert, cooperative, no distress  HENT:   ENT exam normal, no neck nodes or sinus tenderness, neck without nodes, pharynx erythematous without exudate, sinuses nontender and nasal mucosa congested  Eyes:    PERRL, conjunctiva/corneas clear, EOM's intact       Lungs:     Clear to auscultation bilaterally, respirations unlabored  Heart:    Regular rate and rhythm  Neurologic:   Awake, alert, oriented x 3. No apparent focal neurological           defect.          Results for orders placed or performed in visit on 02/21/16  POCT Influenza A/B  Result Value Ref Range   Influenza A, POC Positive (A) Negative   Influenza B, POC Negative Negative       Assessment & Plan:     1. Cough  - POCT Influenza A/B  2. Influenza A  - oseltamivir (TAMIFLU) 75 MG capsule; Take 1 capsule (75 mg total) by mouth 2 (two) times daily.  Dispense: 10 capsule; Refill: 0  Call if symptoms change or if not rapidly improving.           Mila Merryonald Davien Malone, MD  [facility]

## 2016-06-18 HISTORY — PX: CARDIAC CATHETERIZATION: SHX172

## 2016-07-23 ENCOUNTER — Other Ambulatory Visit: Payer: Self-pay | Admitting: Family Medicine

## 2016-07-23 DIAGNOSIS — I10 Essential (primary) hypertension: Secondary | ICD-10-CM

## 2016-08-06 ENCOUNTER — Other Ambulatory Visit: Payer: Self-pay | Admitting: Family Medicine

## 2016-10-20 ENCOUNTER — Encounter: Payer: Self-pay | Admitting: Family Medicine

## 2016-11-19 ENCOUNTER — Encounter: Payer: Self-pay | Admitting: Physician Assistant

## 2016-11-19 ENCOUNTER — Ambulatory Visit (INDEPENDENT_AMBULATORY_CARE_PROVIDER_SITE_OTHER): Payer: BLUE CROSS/BLUE SHIELD | Admitting: Physician Assistant

## 2016-11-19 VITALS — BP 132/96 | HR 64 | Temp 98.2°F | Resp 16 | Ht 64.5 in | Wt 166.2 lb

## 2016-11-19 DIAGNOSIS — Z124 Encounter for screening for malignant neoplasm of cervix: Secondary | ICD-10-CM | POA: Diagnosis not present

## 2016-11-19 DIAGNOSIS — Z0000 Encounter for general adult medical examination without abnormal findings: Secondary | ICD-10-CM | POA: Diagnosis not present

## 2016-11-19 DIAGNOSIS — Z23 Encounter for immunization: Secondary | ICD-10-CM | POA: Diagnosis not present

## 2016-11-19 DIAGNOSIS — Z79890 Hormone replacement therapy (postmenopausal): Secondary | ICD-10-CM | POA: Diagnosis not present

## 2016-11-19 DIAGNOSIS — I10 Essential (primary) hypertension: Secondary | ICD-10-CM | POA: Diagnosis not present

## 2016-11-19 NOTE — Progress Notes (Signed)
Patient: Caroline Bowen, Female    DOB: Aug 31, 1961, 55 y.o.   MRN: [id number] Visit Date: 11/19/2016  Today's Provider: Trinna Post, PA-C   Chief Complaint  Patient presents with  . Annual Exam   Subjective:    Annual physical exam Caroline Bowen is a 55 y.o. female who presents today for health maintenance and complete physical. She feels well. She reports exercising none at this time. Plans to restart soon. She reports she is sleeping fair.She states she wakes at 3 am and is unable to go back to sleep.  Patient works for [employer]. She is married and has one son who is 54 who works for the [employer]. She lives here in [place] with her husband.  She does not and has never smoked. She drinks once a year.  She has lost about 14 pounds since her last annual physical in 2016. She eats well and does exercise regularly, though has been exercising less recently due to stressful events.  Her father recently died two days before Thanksgiving so she is coping with this right now. Her support system is her faith and her husband, who is a deacon.   She does have HTN for which she takes [place] without issue. She is compliant with this medication and does not have any headaches, dizziness, blurred vision. She thinks her BP might be a little high today because of her recent loss of her father. She is not sleeping as well either.  She does not have a family history of breast cancer. She gets mammograms once yearly at [facility], the last being 08/2015. No history of abnormal mammograms.  Her last colonoscopy was in 2014 and was normal. She is to get this again in ten years. She has no family hx of colon cancer.  She used to see Dr. Amalia Hailey at [facility] for gynecology. According to Dr. Sharyon Medicus last note, she had a PAP in 2014 but there is no mention of HPV testing. The patient is not sure if she was also HPV tested at the time, and thus has opted to receive a  PAP/HPV testing today. She takes Combipatch 0.05-0.14 mg/day for postmenopausal vasomotor symptoms. She started about 10 months after menopause and she reports she is doing well on this. No vaginal bleeding.  She wants the flu shot today. -----------------------------------------------------------------   Review of Systems  Constitutional: Negative.   HENT: Negative.   Eyes: Negative.   Respiratory: Negative.   Cardiovascular: Negative.   Gastrointestinal: Negative.   Endocrine: Negative.   Genitourinary: Negative.   Musculoskeletal: Negative.   Skin: Negative.   Allergic/Immunologic: Negative.   Neurological: Negative.   Hematological: Negative.     Social History      She  reports that she has never smoked. She has never used smokeless tobacco. She reports that she does not drink alcohol or use drugs.       Social History   Social History  . Marital status: Married    Spouse name: N/A  . Number of children: N/A  . Years of education: N/A   Social History Main Topics  . Smoking status: Never Smoker  . Smokeless tobacco: Never Used  . Alcohol use No  . Drug use: No  . Sexual activity: Not Asked   Other Topics Concern  . None   Social History Narrative  . None    Past Medical History:  Diagnosis Date  . Anemia   .  Hypertension      Patient Active Problem List   Diagnosis Date Noted  . Menopausal disorder 06/05/2015  . Absolute anemia 05/24/2015  . Accumulation of fluid in tissues 05/24/2015  . Abnormal finding on breast imaging 04/16/2010  . Benign hypertension 09/06/2008  . Headache disorder 09/06/2008    Past Surgical History:  Procedure Laterality Date  . CARDIAC CATHETERIZATION  06/18/2016   Normal coronaries. Normal EF=60%. Dr. Welton Flakes  . CYST EXCISION  2001   wrist  . wrist surgery  2001    Family History        Family Status  Relation Status  . Mother Alive  . Father Alive  . Sister Alive  . Brother Alive  . Sister Alive  . Brother  Deceased  . Brother Alive        Her family history includes Cancer in her mother; Diabetes in her father and sister; Heart failure in her father; Hypertension in her brother, father, mother, sister, and sister; Kidney disease in her father; Pancreatic cancer in her brother; Prostate cancer in her father.     Allergies  Allergen Reactions  . Metoprolol Swelling     Current Outpatient Prescriptions:  .  COMBIPATCH 0.05-0.14 MG/DAY, PLACE 1 PATCH ONTO THE SKIN 2 (TWO) TIMES A WEEK., Disp: 8 patch, Rfl: 5 .  triamterene-hydrochlorothiazide (MAXZIDE-25) 37.5-25 MG tablet, TAKE 1 TABLET BY MOUTH EVERY DAY, Disp: 30 tablet, Rfl: 3   Patient Care Team: Malva Limes, MD as PCP - General (Family Medicine)      Objective:   Vitals: BP (!) 132/96 (BP Location: Right Arm, Patient Position: Sitting, Cuff Size: Normal)   Pulse 64   Temp 98.2 F (36.8 C) (Oral)   Resp 16   Ht 5' 4.5" (1.638 m)   Wt 166 lb 3.2 oz (75.4 kg)   BMI 28.09 kg/m    Physical Exam  Constitutional: She is oriented to person, place, and time. She appears well-developed and well-nourished.  HENT:  Head: Normocephalic.  Right Ear: Tympanic membrane and external ear normal.  Left Ear: Tympanic membrane and external ear normal.  Mouth/Throat: Oropharynx is clear and moist. No oropharyngeal exudate.  Eyes: Conjunctivae are normal. Pupils are equal, round, and reactive to light.  Neck: Neck supple.  Cardiovascular: Normal rate, regular rhythm and normal heart sounds.   Pulmonary/Chest: Effort normal and breath sounds normal. No respiratory distress. She has no wheezes. She has no rales. She exhibits no tenderness. Right breast exhibits no inverted nipple, no mass, no nipple discharge, no skin change and no tenderness. Left breast exhibits no inverted nipple, no mass, no nipple discharge, no skin change and no tenderness. Breasts are symmetrical.  Abdominal: Soft. Bowel sounds are normal. She exhibits no distension.  There is no tenderness. There is no rebound and no guarding.  Genitourinary: Vagina normal and uterus normal. No breast swelling, tenderness, discharge or bleeding. No labial fusion. There is no rash, tenderness, lesion or injury on the right labia. There is no rash, tenderness, lesion or injury on the left labia. Uterus is not deviated, not enlarged, not fixed and not tender. Cervix exhibits no motion tenderness, no discharge and no friability. Right adnexum displays no mass, no tenderness and no fullness. Left adnexum displays no mass, no tenderness and no fullness. No erythema, tenderness or bleeding in the vagina. No signs of injury around the vagina. No vaginal discharge found.  Lymphadenopathy:    She has no cervical adenopathy.  Neurological: She is alert  and oriented to person, place, and time.  Skin: Skin is warm and dry.  Psychiatric: She has a normal mood and affect. Her behavior is normal.     Depression Screen PHQ 2/9 Scores 09/09/2015  PHQ - 2 Score 0      Assessment & Plan:     Routine Health Maintenance and Physical Exam  Exercise Activities and Dietary recommendations Goals    . Exercise 150 minutes per week (moderate activity)       Immunization History  Administered Date(s) Administered  . Influenza,inj,Quad PF,36+ Mos 09/29/2013, 09/09/2015, 11/19/2016    Health Maintenance  Topic Date Due  . TETANUS/TDAP  04/18/1980  . PAP SMEAR  04/18/1982  . MAMMOGRAM  10/09/2017  . COLONOSCOPY  12/30/2022  . INFLUENZA VACCINE  Completed  . Hepatitis C Screening  Completed  . HIV Screening  Completed     Discussed health benefits of physical activity, and encouraged her to engage in regular exercise appropriate for her age and condition.    1. Annual physical exam  Have ordered mammogram and patient may schedule this.  - MM DIGITAL SCREENING BILATERAL; Future  2. Need for influenza vaccination  - Flu Vaccine QUAD 36+ mos PF IM (Fluarix & Fluzone Quad  PF)  3. Benign hypertension  BP is elevated in office today, high diastolic. Offered patient increase in medication but she would rather wait and see if it comes under better control when she gets back to exercising and has more time to process her father's death. Will get labs as below.  - Comprehensive metabolic panel - Lipid panel  4. Cervical cancer screening  PAP and HPV today. If normal, repeat in five years.  - Pap IG, rfx HPV all pth  5. Hormone Replacement Therapy  Have counseled patient on potential risks of HRT including cardiovascular, stroke, and DVT risk. Patient does accept this and feels benefits outweigh the costs of treatment.  The entirety of the information documented in the History of Present Illness, Review of Systems and Physical Exam were personally obtained by me. Portions of this information were initially documented by Tiburcio Pea and reviewed by me for thoroughness and accuracy.    Return in about 1 year (around 11/19/2017) for CPE.  Patient Instructions  Health Maintenance, Female Introduction Adopting a healthy lifestyle and getting preventive care can go a long way to promote health and wellness. Talk with your health care provider about what schedule of regular examinations is right for you. This is a good chance for you to check in with your provider about disease prevention and staying healthy. In between checkups, there are plenty of things you can do on your own. Experts have done a lot of research about which lifestyle changes and preventive measures are most likely to keep you healthy. Ask your health care provider for more information. Weight and diet Eat a healthy diet  Be sure to include plenty of vegetables, fruits, low-fat dairy products, and lean protein.  Do not eat a lot of foods high in solid fats, added sugars, or salt.  Get regular exercise. This is one of the most important things you can do for your health.  Most adults should  exercise for at least 150 minutes each week. The exercise should increase your heart rate and make you sweat (moderate-intensity exercise).  Most adults should also do strengthening exercises at least twice a week. This is in addition to the moderate-intensity exercise. Maintain a healthy weight  Body mass index (  BMI) is a measurement that can be used to identify possible weight problems. It estimates body fat based on height and weight. Your health care provider can help determine your BMI and help you achieve or maintain a healthy weight.  For females 46 years of age and older:  A BMI below 18.5 is considered underweight.  A BMI of 18.5 to 24.9 is normal.  A BMI of 25 to 29.9 is considered overweight.  A BMI of 30 and above is considered obese. Watch levels of cholesterol and blood lipids  You should start having your blood tested for lipids and cholesterol at 55 years of age, then have this test every 5 years.  You may need to have your cholesterol levels checked more often if:  Your lipid or cholesterol levels are high.  You are older than 55 years of age.  You are at high risk for heart disease. Cancer screening Lung Cancer  Lung cancer screening is recommended for adults 24-70 years old who are at high risk for lung cancer because of a history of smoking.  A yearly low-dose CT scan of the lungs is recommended for people who:  Currently smoke.  Have quit within the past 15 years.  Have at least a 30-pack-year history of smoking. A pack year is smoking an average of one pack of cigarettes a day for 1 year.  Yearly screening should continue until it has been 15 years since you quit.  Yearly screening should stop if you develop a health problem that would prevent you from having lung cancer treatment. Breast Cancer  Practice breast self-awareness. This means understanding how your breasts normally appear and feel.  It also means doing regular breast self-exams. Let  your health care provider know about any changes, no matter how small.  If you are in your 20s or 30s, you should have a clinical breast exam (CBE) by a health care provider every 1-3 years as part of a regular health exam.  If you are 38 or older, have a CBE every year. Also consider having a breast X-ray (mammogram) every year.  If you have a family history of breast cancer, talk to your health care provider about genetic screening.  If you are at high risk for breast cancer, talk to your health care provider about having an MRI and a mammogram every year.  Breast cancer gene (BRCA) assessment is recommended for women who have family members with BRCA-related cancers. BRCA-related cancers include:  Breast.  Ovarian.  Tubal.  Peritoneal cancers.  Results of the assessment will determine the need for genetic counseling and BRCA1 and BRCA2 testing. Cervical Cancer  Your health care provider may recommend that you be screened regularly for cancer of the pelvic organs (ovaries, uterus, and vagina). This screening involves a pelvic examination, including checking for microscopic changes to the surface of your cervix (Pap test). You may be encouraged to have this screening done every 3 years, beginning at age 61.  For women ages 61-65, health care providers may recommend pelvic exams and Pap testing every 3 years, or they may recommend the Pap and pelvic exam, combined with testing for human papilloma virus (HPV), every 5 years. Some types of HPV increase your risk of cervical cancer. Testing for HPV may also be done on women of any age with unclear Pap test results.  Other health care providers may not recommend any screening for nonpregnant women who are considered low risk for pelvic cancer and who do not  have symptoms. Ask your health care provider if a screening pelvic exam is right for you.  If you have had past treatment for cervical cancer or a condition that could lead to cancer, you  need Pap tests and screening for cancer for at least 20 years after your treatment. If Pap tests have been discontinued, your risk factors (such as having a new sexual partner) need to be reassessed to determine if screening should resume. Some women have medical problems that increase the chance of getting cervical cancer. In these cases, your health care provider may recommend more frequent screening and Pap tests. Colorectal Cancer  This type of cancer can be detected and often prevented.  Routine colorectal cancer screening usually begins at 55 years of age and continues through 55 years of age.  Your health care provider may recommend screening at an earlier age if you have risk factors for colon cancer.  Your health care provider may also recommend using home test kits to check for hidden blood in the stool.  A small camera at the end of a tube can be used to examine your colon directly (sigmoidoscopy or colonoscopy). This is done to check for the earliest forms of colorectal cancer.  Routine screening usually begins at age 63.  Direct examination of the colon should be repeated every 5-10 years through 55 years of age. However, you may need to be screened more often if early forms of precancerous polyps or small growths are found. Skin Cancer  Check your skin from head to toe regularly.  Tell your health care provider about any new moles or changes in moles, especially if there is a change in a mole's shape or color.  Also tell your health care provider if you have a mole that is larger than the size of a pencil eraser.  Always use sunscreen. Apply sunscreen liberally and repeatedly throughout the day.  Protect yourself by wearing long sleeves, pants, a wide-brimmed hat, and sunglasses whenever you are outside. Heart disease, diabetes, and high blood pressure  High blood pressure causes heart disease and increases the risk of stroke. High blood pressure is more likely to develop  in:  People who have blood pressure in the high end of the normal range (130-139/85-89 mm Hg).  People who are overweight or obese.  People who are African American.  If you are 59-43 years of age, have your blood pressure checked every 3-5 years. If you are 56 years of age or older, have your blood pressure checked every year. You should have your blood pressure measured twice-once when you are at a hospital or clinic, and once when you are not at a hospital or clinic. Record the average of the two measurements. To check your blood pressure when you are not at a hospital or clinic, you can use:  An automated blood pressure machine at a pharmacy.  A home blood pressure monitor.  If you are between 41 years and 36 years old, ask your health care provider if you should take aspirin to prevent strokes.  Have regular diabetes screenings. This involves taking a blood sample to check your fasting blood sugar level.  If you are at a normal weight and have a low risk for diabetes, have this test once every three years after 55 years of age.  If you are overweight and have a high risk for diabetes, consider being tested at a younger age or more often. Preventing infection Hepatitis B  If you have  a higher risk for hepatitis B, you should be screened for this virus. You are considered at high risk for hepatitis B if:  You were born in a country where hepatitis B is common. Ask your health care provider which countries are considered high risk.  Your parents were born in a high-risk country, and you have not been immunized against hepatitis B (hepatitis B vaccine).  You have HIV or AIDS.  You use needles to inject street drugs.  You live with someone who has hepatitis B.  You have had sex with someone who has hepatitis B.  You get hemodialysis treatment.  You take certain medicines for conditions, including cancer, organ transplantation, and autoimmune conditions. Hepatitis C  Blood  testing is recommended for:  Everyone born from [birth year] through 1965.  Anyone with known risk factors for hepatitis C. Sexually transmitted infections (STIs)  You should be screened for sexually transmitted infections (STIs) including gonorrhea and chlamydia if:  You are sexually active and are younger than 55 years of age.  You are older than 55 years of age and your health care provider tells you that you are at risk for this type of infection.  Your sexual activity has changed since you were last screened and you are at an increased risk for chlamydia or gonorrhea. Ask your health care provider if you are at risk.  If you do not have HIV, but are at risk, it may be recommended that you take a prescription medicine daily to prevent HIV infection. This is called pre-exposure prophylaxis (PrEP). You are considered at risk if:  You are sexually active and do not regularly use condoms or know the HIV status of your partner(s).  You take drugs by injection.  You are sexually active with a partner who has HIV. Talk with your health care provider about whether you are at high risk of being infected with HIV. If you choose to begin PrEP, you should first be tested for HIV. You should then be tested every 3 months for as long as you are taking PrEP. Pregnancy  If you are premenopausal and you may become pregnant, ask your health care provider about preconception counseling.  If you may become pregnant, take 400 to 800 micrograms (mcg) of folic acid every day.  If you want to prevent pregnancy, talk to your health care provider about birth control (contraception). Osteoporosis and menopause  Osteoporosis is a disease in which the bones lose minerals and strength with aging. This can result in serious bone fractures. Your risk for osteoporosis can be identified using a bone density scan.  If you are 20 years of age or older, or if you are at risk for osteoporosis and fractures, ask your health  care provider if you should be screened.  Ask your health care provider whether you should take a calcium or vitamin D supplement to lower your risk for osteoporosis.  Menopause may have certain physical symptoms and risks.  Hormone replacement therapy may reduce some of these symptoms and risks. Talk to your health care provider about whether hormone replacement therapy is right for you. Follow these instructions at home:  Schedule regular health, dental, and eye exams.  Stay current with your immunizations.  Do not use any tobacco products including cigarettes, chewing tobacco, or electronic cigarettes.  If you are pregnant, do not drink alcohol.  If you are breastfeeding, limit how much and how often you drink alcohol.  Limit alcohol intake to no more than 1  drink per day for nonpregnant women. One drink equals 12 ounces of beer, 5 ounces of wine, or 1 ounces of hard liquor.  Do not use street drugs.  Do not share needles.  Ask your health care provider for help if you need support or information about quitting drugs.  Tell your health care provider if you often feel depressed.  Tell your health care provider if you have ever been abused or do not feel safe at home. This information is not intended to replace advice given to you by your health care provider. Make sure you discuss any questions you have with your health care provider. Document Released: 05/25/2011 Document Revised: 04/16/2016 Document Reviewed: 08/13/2015  2017 Elsevier     --------------------------------------------------------------------    Trinna Post, PA-C  [facility]

## 2016-11-19 NOTE — Patient Instructions (Signed)

## 2016-11-20 LAB — PAP IG, RFX HPV ALL PTH: PAP Smear Comment: 0

## 2016-11-21 LAB — COMPREHENSIVE METABOLIC PANEL
ALT: 17 IU/L (ref 0–32)
AST: 19 IU/L (ref 0–40)
Albumin/Globulin Ratio: 1.6 (ref 1.2–2.2)
Albumin: 4.2 g/dL (ref 3.5–5.5)
Alkaline Phosphatase: 77 IU/L (ref 39–117)
BUN/Creatinine Ratio: 14 (ref 9–23)
BUN: 15 mg/dL (ref 6–24)
Bilirubin Total: 0.3 mg/dL (ref 0.0–1.2)
CO2: 25 mmol/L (ref 18–29)
Calcium: 9.8 mg/dL (ref 8.7–10.2)
Chloride: 103 mmol/L (ref 96–106)
Creatinine, Ser: 1.09 mg/dL — ABNORMAL HIGH (ref 0.57–1.00)
GFR calc Af Amer: 66 mL/min/{1.73_m2} (ref >59)
GFR calc non Af Amer: 57 mL/min/{1.73_m2} — ABNORMAL LOW (ref >59)
Globulin, Total: 2.7 g/dL (ref 1.5–4.5)
Glucose: 86 mg/dL (ref 65–99)
Potassium: 4.4 mmol/L (ref 3.5–5.2)
Sodium: 144 mmol/L (ref 134–144)
Total Protein: 6.9 g/dL (ref 6.0–8.5)

## 2016-11-21 LAB — LIPID PANEL
Chol/HDL Ratio: 3.1 ratio units (ref 0.0–4.4)
Cholesterol, Total: 223 mg/dL — ABNORMAL HIGH (ref 100–199)
HDL: 72 mg/dL (ref >39)
LDL Calculated: 138 mg/dL — ABNORMAL HIGH (ref 0–99)
Triglycerides: 67 mg/dL (ref 0–149)
VLDL Cholesterol Cal: 13 mg/dL (ref 5–40)

## 2016-11-24 ENCOUNTER — Telehealth: Payer: Self-pay

## 2016-11-24 LAB — HPV DNA PROBE HIGH RISK, AMPLIFIED: HPV, high-risk: NEGATIVE

## 2016-11-24 LAB — SPECIMEN STATUS REPORT

## 2016-11-24 NOTE — Telephone Encounter (Signed)
Pt advised.   Thanks,   -Laura  

## 2016-11-24 NOTE — Telephone Encounter (Signed)
-----   Message from Trey SailorsAdriana M Pollak, [place]PA-C sent at 11/24/2016  8:56 AM EST ----- Metabolic panel stable. Total cholesterol a little high but treatment with medication not indicated. Can try heart healthy, low fat diet and exercise. PAP and HPV test were negative, can repeat in five years if no other symptoms.

## 2016-12-18 ENCOUNTER — Ambulatory Visit
Admission: RE | Admit: 2016-12-18 | Discharge: 2016-12-18 | Disposition: A | Discharge status: Home / Self Care | Payer: BLUE CROSS/BLUE SHIELD | Source: Ambulatory Visit | Attending: Physician Assistant | Admitting: Physician Assistant

## 2016-12-18 DIAGNOSIS — Z1231 Encounter for screening mammogram for malignant neoplasm of breast: Secondary | ICD-10-CM | POA: Insufficient documentation

## 2016-12-18 DIAGNOSIS — Z0000 Encounter for general adult medical examination without abnormal findings: Secondary | ICD-10-CM

## 2016-12-31 ENCOUNTER — Other Ambulatory Visit: Payer: Self-pay | Admitting: Family Medicine

## 2016-12-31 DIAGNOSIS — I10 Essential (primary) hypertension: Secondary | ICD-10-CM

## 2017-03-22 ENCOUNTER — Other Ambulatory Visit: Payer: Self-pay | Admitting: Family Medicine

## 2017-03-22 DIAGNOSIS — I10 Essential (primary) hypertension: Secondary | ICD-10-CM

## 2017-04-16 ENCOUNTER — Other Ambulatory Visit: Payer: Self-pay | Admitting: Family Medicine

## 2017-08-27 ENCOUNTER — Ambulatory Visit (INDEPENDENT_AMBULATORY_CARE_PROVIDER_SITE_OTHER): Payer: BLUE CROSS/BLUE SHIELD | Admitting: Family Medicine

## 2017-08-27 ENCOUNTER — Encounter: Payer: Self-pay | Admitting: Family Medicine

## 2017-08-27 VITALS — BP 116/74 | HR 92 | Temp 98.0°F | Resp 16 | Wt 172.0 lb

## 2017-08-27 DIAGNOSIS — R0982 Postnasal drip: Secondary | ICD-10-CM

## 2017-08-27 DIAGNOSIS — Z23 Encounter for immunization: Secondary | ICD-10-CM | POA: Diagnosis not present

## 2017-08-27 DIAGNOSIS — R0981 Nasal congestion: Secondary | ICD-10-CM

## 2017-08-27 MED ORDER — FLUTICASONE PROPIONATE 50 MCG/ACT NA SUSP: 2.0000 | Freq: Every day | NASAL | 6 refills | Status: DC

## 2017-08-27 NOTE — Patient Instructions (Signed)
Allergic Rhinitis Allergic rhinitis is when the mucous membranes in the nose respond to allergens. Allergens are particles in the air that cause your body to have an allergic reaction. This causes you to release allergic antibodies. Through a chain of events, these eventually cause you to release histamine into the blood stream. Although meant to protect the body, it is this release of histamine that causes your discomfort, such as frequent sneezing, congestion, and an itchy, runny nose. What are the causes? Seasonal allergic rhinitis (hay fever) is caused by pollen allergens that may come from grasses, trees, and weeds. Year-round allergic rhinitis (perennial allergic rhinitis) is caused by allergens such as house dust mites, pet dander, and mold spores. What are the signs or symptoms?  Nasal stuffiness (congestion).  Itchy, runny nose with sneezing and tearing of the eyes. How is this diagnosed? Your health care provider can help you determine the allergen or allergens that trigger your symptoms. If you and your health care provider are unable to determine the allergen, skin or blood testing may be used. Your health care provider will diagnose your condition after taking your health history and performing a physical exam. Your health care provider may assess you for other related conditions, such as asthma, pink eye, or an ear infection. How is this treated? Allergic rhinitis does not have a cure, but it can be controlled by:  Medicines that block allergy symptoms. These may include allergy shots, nasal sprays, and oral antihistamines.  Avoiding the allergen. Hay fever may often be treated with antihistamines in pill or nasal spray forms. Antihistamines block the effects of histamine. There are over-the-counter medicines that may help with nasal congestion and swelling around the eyes. Check with your health care provider before taking or giving this medicine. If avoiding the allergen or the  medicine prescribed do not work, there are many new medicines your health care provider can prescribe. Stronger medicine may be used if initial measures are ineffective. Desensitizing injections can be used if medicine and avoidance does not work. Desensitization is when a patient is given ongoing shots until the body becomes less sensitive to the allergen. Make sure you follow up with your health care provider if problems continue. Follow these instructions at home: It is not possible to completely avoid allergens, but you can reduce your symptoms by taking steps to limit your exposure to them. It helps to know exactly what you are allergic to so that you can avoid your specific triggers. Contact a health care provider if:  You have a fever.  You develop a cough that does not stop easily (persistent).  You have shortness of breath.  You start wheezing.  Symptoms interfere with normal daily activities. This information is not intended to replace advice given to you by your health care provider. Make sure you discuss any questions you have with your health care provider. Document Released: 08/04/2001 Document Revised: 07/10/2016 Document Reviewed: 07/17/2013 Elsevier Interactive Patient Education  2017 Elsevier Inc.  

## 2017-08-27 NOTE — Progress Notes (Signed)
Patient: Caroline Bowen Female    DOB: January 03, 1961   56 y.o.   MRN: [id number] Visit Date: 08/27/2017  Today's Provider: Shirlee Latch, MD   Chief Complaint  Patient presents with  . Sinus Problem   Subjective:    Sinus Problem  This is a new problem. Episode onset: onset was 2 weeks ago; improved for one week, but has since reoccurred. The problem has been waxing and waning since onset. There has been no fever. She is experiencing no pain. Associated symptoms include chills (slight), congestion, coughing (slight; improved with Halls. Dry), sinus pressure and sneezing. Pertinent negatives include no diaphoresis, ear pain, headaches, hoarse voice, neck pain, shortness of breath, sore throat or swollen glands. Treatments tried: Halls, Vitamin C, APAP. The treatment provided mild relief.   Raw feeling throat started after trip to [place], took [employer] and felt better after ~2 dasy.  Last week felt great and now with cough, congestion, chills, ear itching x3-4 days.  Wondering why it came back so quickly.  No sinus pain/pressure.  +postnasal drip.  No fevers.  Feeling better today than yesterday.  Does not take any allergy medication but did see an allergist in the past and did have some positive tests.    Allergies  Allergen Reactions  . Metoprolol Swelling     Current Outpatient Prescriptions:  .  COMBIPATCH 0.05-0.14 MG/DAY, PLACE 1 PATCH ONTO THE SKIN 2 (TWO) TIMES A WEEK., Disp: 8 patch, Rfl: 6 .  triamterene-hydrochlorothiazide (MAXZIDE-25) 37.5-25 MG tablet, TAKE 1 TABLET BY MOUTH EVERY DAY (Patient taking differently: TAKE 1/2 TABLET BY MOUTH EVERY DAY), Disp: 30 tablet, Rfl: 6  Review of Systems  Constitutional: Positive for chills (slight). Negative for diaphoresis.  HENT: Positive for congestion, sinus pressure and sneezing. Negative for ear pain, hoarse voice and sore throat.   Respiratory: Positive for cough (slight; improved with Halls. Dry). Negative for shortness of  breath.   Musculoskeletal: Negative for neck pain.  Neurological: Negative for headaches.    Social History  Substance Use Topics  . Smoking status: Never Smoker  . Smokeless tobacco: Never Used  . Alcohol use No   Objective:   BP 116/74 (BP Location: Left Arm, Patient Position: Sitting, Cuff Size: Normal)   Pulse 92   Temp 98 F (36.7 C) (Oral)   Resp 16   Wt 172 lb (78 kg)   SpO2 98%   BMI 29.07 kg/m  Vitals:   08/27/17 1343  BP: 116/74  Pulse: 92  Resp: 16  Temp: 98 F (36.7 C)  TempSrc: Oral  SpO2: 98%  Weight: 172 lb (78 kg)     Physical Exam  Constitutional: She is oriented to person, place, and time. She appears well-developed and well-nourished. No distress.  HENT:  Head: Normocephalic and atraumatic.  Right Ear: Tympanic membrane, external ear and ear canal normal.  Left Ear: Tympanic membrane, external ear and ear canal normal.  Nose: Mucosal edema present. Right sinus exhibits no maxillary sinus tenderness and no frontal sinus tenderness. Left sinus exhibits no maxillary sinus tenderness and no frontal sinus tenderness.  Mouth/Throat: Uvula is midline and mucous membranes are normal. Posterior oropharyngeal erythema present. No oropharyngeal exudate or tonsillar abscesses.  Eyes: Pupils are equal, round, and reactive to light. Conjunctivae are normal. No scleral icterus.  Neck: Neck supple. No thyromegaly present.  Cardiovascular: Normal rate, regular rhythm and normal heart sounds.   Pulmonary/Chest: Effort normal and breath sounds normal. No respiratory distress. She has  no wheezes. She has no rales.  Lymphadenopathy:    She has no cervical adenopathy.  Neurological: She is alert and oriented to person, place, and time.  Skin: Skin is warm and dry. No rash noted.  Psychiatric: She has a normal mood and affect. Her behavior is normal.  Vitals reviewed.     Assessment & Plan:     1. Sinus congestion 2. Postnasal drip - symptoms most likely c/w  allergic rhinitis - start antihistamine and flonase spray daily - return precautions discussed  Meds ordered this encounter  Medications  . fluticasone (FLONASE) 50 MCG/ACT nasal spray    Sig: Place 2 sprays into both nostrils daily.    Dispense:  16 g    Refill:  6    Return in about 3 months (around 11/27/2017) for CPE.     The entirety of the information documented in the History of Present Illness, Review of Systems and Physical Exam were personally obtained by me. Portions of this information were initially documented by Irving Burton Ratchford, CMA and reviewed by me for thoroughness and accuracy.     Shirlee Latch, MD  [facility]

## 2017-11-10 ENCOUNTER — Other Ambulatory Visit: Payer: Self-pay | Admitting: Family Medicine

## 2017-11-10 ENCOUNTER — Other Ambulatory Visit: Payer: Self-pay | Admitting: Physician Assistant

## 2017-11-10 DIAGNOSIS — Z1231 Encounter for screening mammogram for malignant neoplasm of breast: Secondary | ICD-10-CM

## 2017-11-26 ENCOUNTER — Other Ambulatory Visit: Payer: Self-pay

## 2017-11-26 ENCOUNTER — Encounter: Payer: Self-pay | Admitting: Family Medicine

## 2017-11-26 ENCOUNTER — Ambulatory Visit (INDEPENDENT_AMBULATORY_CARE_PROVIDER_SITE_OTHER): Payer: BLUE CROSS/BLUE SHIELD | Admitting: Family Medicine

## 2017-11-26 VITALS — BP 128/84 | HR 88 | Temp 98.4°F | Resp 16 | Ht 65.0 in | Wt 174.0 lb

## 2017-11-26 DIAGNOSIS — I10 Essential (primary) hypertension: Secondary | ICD-10-CM | POA: Diagnosis not present

## 2017-11-26 DIAGNOSIS — Z23 Encounter for immunization: Secondary | ICD-10-CM | POA: Diagnosis not present

## 2017-11-26 DIAGNOSIS — Z0000 Encounter for general adult medical examination without abnormal findings: Secondary | ICD-10-CM

## 2017-11-26 DIAGNOSIS — D649 Anemia, unspecified: Secondary | ICD-10-CM

## 2017-11-26 NOTE — Progress Notes (Signed)
Patient: Caroline Bowen, Female    DOB: 21-Dec-1960, 57 y.o.   MRN: [id number] Visit Date: 11/26/2017  Today's Provider: Shirlee Latch, MD   I, Joslyn Hy, CMA, am acting as scribe for Shirlee Latch, MD.  Chief Complaint  Patient presents with  . Annual Exam   Subjective:    Annual physical exam Caroline Bowen is a 57 y.o. female who presents today for health maintenance and complete physical. She feels well. She reports exercising 3 days a week for 20 minutes. She walks on the treadmill, and just started this back. She reports she is sleeping well.  Last pap- 11/19/2016- NIL; HPV negative. Repeat 5 years. Last mammogram- 12/18/2016- BI-RADS 1. Has mammo scheduled for 12/20/2017. Last colonoscopy- 12/30/2012- normal. -----------------------------------------------------------------   Review of Systems  Constitutional: Negative.   HENT: Negative.   Eyes: Negative.   Respiratory: Negative.   Cardiovascular: Negative.   Gastrointestinal: Negative.   Endocrine: Negative.   Genitourinary: Negative.   Musculoskeletal: Negative.   Skin: Negative.   Allergic/Immunologic: Negative.   Neurological: Negative.   Hematological: Negative.   Psychiatric/Behavioral: Negative.     Social History      She  reports that  has never smoked. she has never used smokeless tobacco. She reports that she does not drink alcohol or use drugs.       Social History   Socioeconomic History  . Marital status: Married    Spouse name: Trinna Post  . Number of children: 1  . Years of education: 99  . Highest education level: Bachelor's degree (e.g., BA, AB, BS)  Social Needs  . Financial resource strain: Not hard at all  . Food insecurity - worry: Never true  . Food insecurity - inability: Never true  . Transportation needs - medical: No  . Transportation needs - non-medical: No  Occupational History  . None  Tobacco Use  . Smoking status: Never Smoker  . Smokeless tobacco: Never  Used  Substance and Sexual Activity  . Alcohol use: No    Comment: very rare  . Drug use: No  . Sexual activity: Yes    Birth control/protection: Post-menopausal  Other Topics Concern  . None  Social History Narrative  . None    Past Medical History:  Diagnosis Date  . Anemia   . Hypertension      Patient Active Problem List   Diagnosis Date Noted  . Menopausal disorder 06/05/2015  . Absolute anemia 05/24/2015  . Accumulation of fluid in tissues 05/24/2015  . Abnormal finding on breast imaging 04/16/2010  . Benign hypertension 09/06/2008  . Headache disorder 09/06/2008    Past Surgical History:  Procedure Laterality Date  . CARDIAC CATHETERIZATION  06/18/2016   Normal coronaries. Normal EF=60%. Dr. Welton Flakes  . CYST EXCISION  2001   wrist  . wrist surgery  2001    Family History        Family Status  Relation Name Status  . Mother  Alive  . Father  Deceased  . Sister  Alive  . Brother  Alive  . Sister  Alive  . Brother  Deceased  . Brother  Alive  . Neg Hx  (Not Specified)        Her family history includes Cancer in her mother; Diabetes in her father and sister; Heart failure in her father; Hypertension in her brother, brother, father, mother, sister, and sister; Kidney disease in her father; Ovarian cancer in her mother; Pancreatic cancer  in her brother; Prostate cancer in her father.     Allergies  Allergen Reactions  . Metoprolol Swelling     Current Outpatient Medications:  .  COMBIPATCH 0.05-0.14 MG/DAY, PLACE 1 PATCH ONTO THE SKIN 2 (TWO) TIMES A WEEK., Disp: 8 patch, Rfl: 6 .  triamterene-hydrochlorothiazide (MAXZIDE-25) 37.5-25 MG tablet, TAKE 1 TABLET BY MOUTH EVERY DAY (Patient taking differently: TAKE 1/2 TABLET BY MOUTH EVERY DAY), Disp: 30 tablet, Rfl: 6 .  vitamin C (ASCORBIC ACID) 500 MG tablet, Take 500 mg by mouth daily., Disp: , Rfl:  .  fluticasone (FLONASE) 50 MCG/ACT nasal spray, Place 2 sprays into both nostrils daily. (Patient not  taking: Reported on 11/26/2017), Disp: 16 g, Rfl: 6   Patient Care Team: Erasmo DownerBacigalupo, Maizey Menendez M, MD as PCP - General (Family Medicine)      Objective:   Vitals: BP 128/84 (BP Location: Left Arm, Patient Position: Sitting, Cuff Size: Large)   Pulse 88   Temp 98.4 F (36.9 C) (Oral)   Resp 16   Ht 5\' 5"  (1.651 m)   Wt 174 lb (78.9 kg)   SpO2 96%   BMI 28.96 kg/m    Vitals:   11/26/17 1023  BP: 128/84  Pulse: 88  Resp: 16  Temp: 98.4 F (36.9 C)  TempSrc: Oral  SpO2: 96%  Weight: 174 lb (78.9 kg)  Height: 5\' 5"  (1.651 m)     Physical Exam  Constitutional: She is oriented to person, place, and time. She appears well-developed and well-nourished. No distress.  HENT:  Head: Normocephalic and atraumatic.  Right Ear: External ear normal.  Left Ear: External ear normal.  Nose: Nose normal.  Mouth/Throat: Oropharynx is clear and moist.  Eyes: Conjunctivae and EOM are normal. Pupils are equal, round, and reactive to light. No scleral icterus.  Neck: Neck supple. No thyromegaly present.  Cardiovascular: Normal rate, regular rhythm, normal heart sounds and intact distal pulses.  No murmur heard. Pulmonary/Chest: Breath sounds normal. No respiratory distress. She has no wheezes. She has no rales.  Abdominal: Soft. Bowel sounds are normal. She exhibits no distension. There is no tenderness. There is no rebound and no guarding.  Musculoskeletal: She exhibits no edema or deformity.  Lymphadenopathy:    She has no cervical adenopathy.  Neurological: She is alert and oriented to person, place, and time.  Skin: Skin is warm and dry. No rash noted.  Psychiatric: She has a normal mood and affect. Her behavior is normal.  Vitals reviewed.    Depression Screen PHQ 2/9 Scores 11/26/2017 09/09/2015  PHQ - 2 Score 1 0     Assessment & Plan:     Routine Health Maintenance and Physical Exam  Exercise Activities and Dietary recommendations Goals    . Exercise 150 minutes per week  (moderate activity)       Immunization History  Administered Date(s) Administered  . Influenza,inj,Quad PF,6+ Mos 09/29/2013, 09/09/2015, 11/19/2016, 08/27/2017  . Tdap 11/26/2017    Health Maintenance  Topic Date Due  . Janet BerlinETANUS/TDAP  04/18/1980  . MAMMOGRAM  12/18/2018  . PAP SMEAR  11/19/2021  . COLONOSCOPY  12/30/2022  . INFLUENZA VACCINE  Completed  . Hepatitis C Screening  Completed  . HIV Screening  Completed     Discussed health benefits of physical activity, and encouraged her to engage in regular exercise appropriate for her age and condition.    Problem List Items Addressed This Visit      Cardiovascular and Mediastinum   Benign hypertension  Relevant Orders   Comprehensive metabolic panel     Other   Absolute anemia   Relevant Orders   CBC    Other Visit Diagnoses    Encounter for annual physical exam    -  Primary   Relevant Orders   Comprehensive metabolic panel   Lipid Profile   CBC   Need for Tdap vaccination       Relevant Orders   Tdap vaccine greater than or equal to 57yo IM (Completed)      -------------------------------------------------------------------- Return in about 1 year (around 11/26/2018) for Physical.   The entirety of the information documented in the History of Present Illness, Review of Systems and Physical Exam were personally obtained by me. Portions of this information were initially documented by Irving Burton Ratchford, CMA and reviewed by me for thoroughness and accuracy.    Erasmo Downer, MD, MPH [facility] 11/26/2017 11:20 AM

## 2017-11-26 NOTE — Patient Instructions (Signed)
Preventive Care 40-64 Years, Female Preventive care refers to lifestyle choices and visits with your health care provider that can promote health and wellness. What does preventive care include?  A yearly physical exam. This is also called an annual well check.  Dental exams once or twice a year.  Routine eye exams. Ask your health care provider how often you should have your eyes checked.  Personal lifestyle choices, including: ? Daily care of your teeth and gums. ? Regular physical activity. ? Eating a healthy diet. ? Avoiding tobacco and drug use. ? Limiting alcohol use. ? Practicing safe sex. ? Taking low-dose aspirin daily starting at age 58. ? Taking vitamin and mineral supplements as recommended by your health care provider. What happens during an annual well check? The services and screenings done by your health care provider during your annual well check will depend on your age, overall health, lifestyle risk factors, and family history of disease. Counseling Your health care provider may ask you questions about your:  Alcohol use.  Tobacco use.  Drug use.  Emotional well-being.  Home and relationship well-being.  Sexual activity.  Eating habits.  Work and work Statistician.  Method of birth control.  Menstrual cycle.  Pregnancy history.  Screening You may have the following tests or measurements:  Height, weight, and BMI.  Blood pressure.  Lipid and cholesterol levels. These may be checked every 5 years, or more frequently if you are over 81 years old.  Skin check.  Lung cancer screening. You may have this screening every year starting at age 78 if you have a 30-pack-year history of smoking and currently smoke or have quit within the past 15 years.  Fecal occult blood test (FOBT) of the stool. You may have this test every year starting at age 65.  Flexible sigmoidoscopy or colonoscopy. You may have a sigmoidoscopy every 5 years or a colonoscopy  every 10 years starting at age 30.  Hepatitis C blood test.  Hepatitis B blood test.  Sexually transmitted disease (STD) testing.  Diabetes screening. This is done by checking your blood sugar (glucose) after you have not eaten for a while (fasting). You may have this done every 1-3 years.  Mammogram. This may be done every 1-2 years. Talk to your health care provider about when you should start having regular mammograms. This may depend on whether you have a family history of breast cancer.  BRCA-related cancer screening. This may be done if you have a family history of breast, ovarian, tubal, or peritoneal cancers.  Pelvic exam and Pap test. This may be done every 3 years starting at age 80. Starting at age 36, this may be done every 5 years if you have a Pap test in combination with an HPV test.  Bone density scan. This is done to screen for osteoporosis. You may have this scan if you are at high risk for osteoporosis.  Discuss your test results, treatment options, and if necessary, the need for more tests with your health care provider. Vaccines Your health care provider may recommend certain vaccines, such as:  Influenza vaccine. This is recommended every year.  Tetanus, diphtheria, and acellular pertussis (Tdap, Td) vaccine. You may need a Td booster every 10 years.  Varicella vaccine. You may need this if you have not been vaccinated.  Zoster vaccine. You may need this after age 5.  Measles, mumps, and rubella (MMR) vaccine. You may need at least one dose of MMR if you were born in  1957 or later. You may also need a second dose.  Pneumococcal 13-valent conjugate (PCV13) vaccine. You may need this if you have certain conditions and were not previously vaccinated.  Pneumococcal polysaccharide (PPSV23) vaccine. You may need one or two doses if you smoke cigarettes or if you have certain conditions.  Meningococcal vaccine. You may need this if you have certain  conditions.  Hepatitis A vaccine. You may need this if you have certain conditions or if you travel or work in places where you may be exposed to hepatitis A.  Hepatitis B vaccine. You may need this if you have certain conditions or if you travel or work in places where you may be exposed to hepatitis B.  Haemophilus influenzae type b (Hib) vaccine. You may need this if you have certain conditions.  Talk to your health care provider about which screenings and vaccines you need and how often you need them. This information is not intended to replace advice given to you by your health care provider. Make sure you discuss any questions you have with your health care provider. Document Released: 12/06/2015 Document Revised: 07/29/2016 Document Reviewed: 09/10/2015 Elsevier Interactive Patient Education  2018 Elsevier Inc.  

## 2017-12-06 DIAGNOSIS — Z0000 Encounter for general adult medical examination without abnormal findings: Secondary | ICD-10-CM | POA: Diagnosis not present

## 2017-12-06 DIAGNOSIS — I10 Essential (primary) hypertension: Secondary | ICD-10-CM | POA: Diagnosis not present

## 2017-12-06 DIAGNOSIS — D649 Anemia, unspecified: Secondary | ICD-10-CM | POA: Diagnosis not present

## 2017-12-07 ENCOUNTER — Telehealth: Payer: Self-pay

## 2017-12-07 LAB — COMPREHENSIVE METABOLIC PANEL
ALT: 15 IU/L (ref 0–32)
AST: 23 IU/L (ref 0–40)
Albumin/Globulin Ratio: 1.2 (ref 1.2–2.2)
Albumin: 4.2 g/dL (ref 3.5–5.5)
Alkaline Phosphatase: 73 IU/L (ref 39–117)
BUN/Creatinine Ratio: 11 (ref 9–23)
BUN: 12 mg/dL (ref 6–24)
Bilirubin Total: 0.3 mg/dL (ref 0.0–1.2)
CO2: 25 mmol/L (ref 20–29)
Calcium: 9.8 mg/dL (ref 8.7–10.2)
Chloride: 102 mmol/L (ref 96–106)
Creatinine, Ser: 1.11 mg/dL — ABNORMAL HIGH (ref 0.57–1.00)
GFR calc Af Amer: 64 mL/min/{1.73_m2} (ref >59)
GFR calc non Af Amer: 56 mL/min/{1.73_m2} — ABNORMAL LOW (ref >59)
Globulin, Total: 3.4 g/dL (ref 1.5–4.5)
Glucose: 95 mg/dL (ref 65–99)
Potassium: 4.5 mmol/L (ref 3.5–5.2)
Sodium: 144 mmol/L (ref 134–144)
Total Protein: 7.6 g/dL (ref 6.0–8.5)

## 2017-12-07 LAB — LIPID PANEL
CHOL/HDL RATIO: 3.1 ratio (ref 0.0–4.4)
Cholesterol, Total: 225 mg/dL — ABNORMAL HIGH (ref 100–199)
HDL: 72 mg/dL (ref >39)
LDL CALC: 138 mg/dL — AB (ref 0–99)
Triglycerides: 74 mg/dL (ref 0–149)
VLDL CHOLESTEROL CAL: 15 mg/dL (ref 5–40)

## 2017-12-07 LAB — CBC
Hematocrit: 38.3 % (ref 34.0–46.6)
Hemoglobin: 12.8 g/dL (ref 11.1–15.9)
MCH: 31.9 pg (ref 26.6–33.0)
MCHC: 33.4 g/dL (ref 31.5–35.7)
MCV: 96 fL (ref 79–97)
Platelets: 240 10*3/uL (ref 150–379)
RBC: 4.01 x10E6/uL (ref 3.77–5.28)
RDW: 13.3 % (ref 12.3–15.4)
WBC: 5.9 10*3/uL (ref 3.4–10.8)

## 2017-12-07 NOTE — Telephone Encounter (Signed)
Left message advising pt. OK per DPR. 

## 2017-12-07 NOTE — Telephone Encounter (Signed)
-----   Message from Erasmo DownerAngela M Bacigalupo, MD sent at 12/07/2017  9:57 AM EST ----- Stable kidney function.  Normal liver function, electrolytes, blood counts.  Cholesterol is slightly high.  10 year risk of heart disease/stroke is fine at 4.6%.  No need for medications currently  Erasmo DownerBacigalupo, Angela M, MD, MPH [facility] 12/07/2017 9:57 AM

## 2017-12-20 ENCOUNTER — Ambulatory Visit
Admission: RE | Admit: 2017-12-20 | Discharge: 2017-12-20 | Disposition: A | Discharge status: Home / Self Care | Payer: BLUE CROSS/BLUE SHIELD | Source: Ambulatory Visit | Attending: Family Medicine | Admitting: Family Medicine

## 2017-12-20 DIAGNOSIS — Z1231 Encounter for screening mammogram for malignant neoplasm of breast: Secondary | ICD-10-CM | POA: Diagnosis not present

## 2017-12-21 ENCOUNTER — Other Ambulatory Visit: Payer: Self-pay | Admitting: Family Medicine

## 2017-12-21 DIAGNOSIS — R928 Other abnormal and inconclusive findings on diagnostic imaging of breast: Secondary | ICD-10-CM

## 2017-12-21 DIAGNOSIS — N6489 Other specified disorders of breast: Secondary | ICD-10-CM

## 2018-01-03 ENCOUNTER — Ambulatory Visit
Admission: RE | Admit: 2018-01-03 | Discharge: 2018-01-03 | Disposition: A | Discharge status: Home / Self Care | Payer: BLUE CROSS/BLUE SHIELD | Source: Ambulatory Visit | Attending: Family Medicine | Admitting: Family Medicine

## 2018-01-03 ENCOUNTER — Other Ambulatory Visit: Payer: Self-pay | Admitting: Family Medicine

## 2018-01-03 DIAGNOSIS — R928 Other abnormal and inconclusive findings on diagnostic imaging of breast: Secondary | ICD-10-CM | POA: Insufficient documentation

## 2018-01-03 DIAGNOSIS — N6489 Other specified disorders of breast: Secondary | ICD-10-CM | POA: Diagnosis not present

## 2018-01-03 DIAGNOSIS — R922 Inconclusive mammogram: Secondary | ICD-10-CM | POA: Diagnosis not present

## 2018-02-18 ENCOUNTER — Ambulatory Visit (INDEPENDENT_AMBULATORY_CARE_PROVIDER_SITE_OTHER): Payer: BLUE CROSS/BLUE SHIELD | Admitting: Family Medicine

## 2018-02-18 ENCOUNTER — Encounter: Payer: Self-pay | Admitting: Family Medicine

## 2018-02-18 VITALS — BP 150/98 | HR 77 | Temp 98.3°F | Resp 16 | Wt 176.0 lb

## 2018-02-18 DIAGNOSIS — N3090 Cystitis, unspecified without hematuria: Secondary | ICD-10-CM

## 2018-02-18 DIAGNOSIS — H5213 Myopia, bilateral: Secondary | ICD-10-CM | POA: Diagnosis not present

## 2018-02-18 DIAGNOSIS — J069 Acute upper respiratory infection, unspecified: Secondary | ICD-10-CM

## 2018-02-18 NOTE — Progress Notes (Signed)
Patient: Caroline SalkDiane B Bowen Female    DOB: 05/14/1961   57 y.o.   MRN: [id number] Visit Date: 02/18/2018  Today's Provider: Shirlee LatchAngela Dreya Buhrman, MD   I, Joslyn HyEmily Ratchford, CMA, am acting as scribe for Shirlee LatchAngela Emonni Depasquale, MD.  Chief Complaint  Patient presents with  . URI   Subjective:    URI   This is a new problem. Episode onset: x 6 days. The problem has been gradually improving. There has been no fever. Associated symptoms include congestion, coughing (some), ear pain (while on airplane; recently traveled to [place]), headaches, a plugged ear sensation (while on airplane), rhinorrhea, sneezing and a sore throat (at onset). Pertinent negatives include no abdominal pain, chest pain, diarrhea, dysuria, nausea, neck pain, sinus pain, swollen glands, vomiting or wheezing. Treatments tried: cough drops, Alkaseltzer night and day, Emergen-C, Day/NyQuil. The treatment provided mild relief.       Allergies  Allergen Reactions  . Metoprolol Swelling     Current Outpatient Medications:  .  COMBIPATCH 0.05-0.14 MG/DAY, PLACE 1 PATCH ONTO THE SKIN 2 (TWO) TIMES A WEEK., Disp: 8 patch, Rfl: 6 .  fluticasone (FLONASE) 50 MCG/ACT nasal spray, Place 2 sprays into both nostrils daily., Disp: 16 g, Rfl: 6 .  triamterene-hydrochlorothiazide (MAXZIDE-25) 37.5-25 MG tablet, TAKE 1 TABLET BY MOUTH EVERY DAY (Patient taking differently: TAKE 1/2 TABLET BY MOUTH EVERY DAY), Disp: 30 tablet, Rfl: 6 .  vitamin C (ASCORBIC ACID) 500 MG tablet, Take 500 mg by mouth daily., Disp: , Rfl:   Review of Systems  HENT: Positive for congestion, ear pain (while on airplane; recently traveled to [place]), rhinorrhea, sneezing and sore throat (at onset). Negative for sinus pain.   Respiratory: Positive for cough (some). Negative for wheezing.   Cardiovascular: Negative for chest pain.  Gastrointestinal: Negative for abdominal pain, diarrhea, nausea and vomiting.  Genitourinary: Negative for dysuria.  Musculoskeletal:  Negative for neck pain.  Neurological: Positive for headaches.    Social History   Tobacco Use  . Smoking status: Never Smoker  . Smokeless tobacco: Never Used  Substance Use Topics  . Alcohol use: No    Comment: very rare   Objective:   BP (!) 150/98 (BP Location: Left Arm, Patient Position: Sitting, Cuff Size: Normal)   Pulse 77   Temp 98.3 F (36.8 C) (Oral)   Resp 16   Wt 176 lb (79.8 kg)   SpO2 97%   BMI 29.29 kg/m  Vitals:   02/18/18 1322  BP: (!) 150/98  Pulse: 77  Resp: 16  Temp: 98.3 F (36.8 C)  TempSrc: Oral  SpO2: 97%  Weight: 176 lb (79.8 kg)     Physical Exam  Constitutional: She is oriented to person, place, and time. She appears well-developed and well-nourished. No distress.  HENT:  Head: Normocephalic and atraumatic.  Right Ear: Tympanic membrane, external ear and ear canal normal.  Left Ear: Tympanic membrane, external ear and ear canal normal.  Nose: Mucosal edema present. Right sinus exhibits no maxillary sinus tenderness and no frontal sinus tenderness. Left sinus exhibits no maxillary sinus tenderness and no frontal sinus tenderness.  Mouth/Throat: Uvula is midline, oropharynx is clear and moist and mucous membranes are normal.  Large tonsils (baseline for patient)  Eyes: Pupils are equal, round, and reactive to light. Conjunctivae and EOM are normal. No scleral icterus.  Neck: Neck supple. No thyromegaly present.  Cardiovascular: Normal rate, regular rhythm, normal heart sounds and intact distal pulses.  No murmur heard. Pulmonary/Chest: Effort  normal and breath sounds normal. No respiratory distress. She has no wheezes. She has no rales.  Musculoskeletal: She exhibits no edema.  Lymphadenopathy:    She has no cervical adenopathy.  Neurological: She is alert and oriented to person, place, and time.  Skin: Skin is warm and dry. No rash noted.  Psychiatric: She has a normal mood and affect. Her behavior is normal.  Vitals reviewed.       Assessment & Plan:     1. Viral URI - symptoms and exam c/w viral URI vs allergic rhinitis - no evidence of strep pharyngitis, CAP, AOM, bacterial sinusitis, or other bacterial infection - discussed symptomatic management, natural course, and return precautions - if last longer than 10 days, likely allergies - recommend flonase, zyrtec  **Cystitis - entered in error in wrong patient's chart.  No UA was done on this patient today.  She has no urinary symptoms.  Will work with IT to remove results from this patient's chart.  Return if symptoms worsen or fail to improve.   The entirety of the information documented in the History of Present Illness, Review of Systems and Physical Exam were personally obtained by me. Portions of this information were initially documented by Irving Burton Ratchford, CMA and reviewed by me for thoroughness and accuracy.    Erasmo Downer, MD, MPH [facility] 02/18/2018 1:51 PM

## 2018-02-18 NOTE — Progress Notes (Signed)
Order(s) created erroneously. Erroneous order ID: [id number]  Order moved by: Jammie Troup F  Order move date/time: 02/18/2018 5:23 PM  Source Patient: [id number]  Source Contact: 02/18/2018  Destination Patient: [id number]  Destination Contact: 02/07/2013 

## 2018-02-18 NOTE — Patient Instructions (Signed)
Preventing High Cholesterol Cholesterol is a waxy, fat-like substance that your body needs in small amounts. Your liver makes all the cholesterol that your body needs. Having high cholesterol (hypercholesterolemia) increases your risk for heart disease and stroke. Extra (excess) cholesterol comes from the food you eat, such as animal-based fat (saturated fat) from meat and some dairy products. High cholesterol can often be prevented with diet and lifestyle changes. If you already have high cholesterol, you can control it with diet and lifestyle changes, as well as medicine. What nutrition changes can be made?  Eat less saturated fat. Foods that contain saturated fat include red meat and some dairy products.  Avoid processed meats, like bacon and lunch meats.  Avoid trans fats, which are found in margarine and some baked goods.  Avoid foods and beverages that have added sugars.  Eat more fruits, vegetables, and whole grains.  Choose healthy sources of protein, such as fish, poultry, and nuts.  Choose healthy sources of fat, such as: ? Nuts. ? Vegetable oils, especially olive oil. ? Fish that have healthy fats (omega-3 fatty acids), such as mackerel or salmon. What lifestyle changes can be made?  Lose weight if you are overweight. Losing 5-10 lb (2.3-4.5 kg) can help prevent or control high cholesterol and reduce your risk for diabetes and high blood pressure. Ask your health care provider to help you with a diet and exercise plan to safely lose weight.  Get enough exercise. Do at least 150 minutes of moderate-intensity exercise each week. ? You could do this in short exercise sessions several times a day, or you could do longer exercise sessions a few times a week. For example, you could take a brisk 10-minute walk or bike ride, 3 times a day, for 5 days a week.  Do not smoke. If you need help quitting, ask your health care provider.  Limit your alcohol intake. If you drink alcohol,  limit alcohol intake to no more than 1 drink a day for nonpregnant women and 2 drinks a day for men. One drink equals 12 oz of beer, 5 oz of wine, or 1 oz of hard liquor. Why are these changes important? If you have high cholesterol, deposits (plaques) may build up on the walls of your blood vessels. Plaques make the arteries narrower and stiffer, which can restrict or block blood flow and cause blood clots to form. This greatly increases your risk for heart attack and stroke. Making diet and lifestyle changes can reduce your risk for these life-threatening conditions. What can I do to lower my risk?  Manage your risk factors for high cholesterol. Talk with your health care provider about all of your risk factors and how to lower your risk.  Manage other conditions that you have, such as diabetes or high blood pressure (hypertension).  Have your cholesterol checked at regular intervals.  Keep all follow-up visits as told by your health care provider. This is important. How is this treated? In addition to diet and lifestyle changes, your health care provider may recommend medicines to help lower cholesterol, such as a medicine to reduce the amount of cholesterol made in your liver. You may need medicine if:  Diet and lifestyle changes do not lower your cholesterol enough.  You have high cholesterol and other risk factors for heart disease or stroke.  Take over-the-counter and prescription medicines only as told by your health care provider. Where to find more information:  American Heart Association: www.heart.org/HEARTORG/Conditions/Cholesterol/Cholesterol_UCM_001089_SubHomePage.jsp  National Heart, Lung, and   Blood Institute: www.nhlbi.nih.gov/health/resources/heart/heart-cholesterol-hbc-what-html Summary  High cholesterol increases your risk for heart disease and stroke. By keeping your cholesterol level low, you can reduce your risk for these conditions.  Diet and lifestyle changes  are the most important steps in preventing high cholesterol.  Work with your health care provider to manage your risk factors, and have your blood tested regularly. This information is not intended to replace advice given to you by your health care provider. Make sure you discuss any questions you have with your health care provider. Document Released: 11/24/2015 Document Revised: 07/18/2016 Document Reviewed: 07/18/2016 Elsevier Interactive Patient Education  2018 [employer] Inc.  

## 2018-02-24 ENCOUNTER — Other Ambulatory Visit: Payer: Self-pay | Admitting: Family Medicine

## 2018-02-24 DIAGNOSIS — I10 Essential (primary) hypertension: Secondary | ICD-10-CM

## 2018-08-26 ENCOUNTER — Ambulatory Visit (INDEPENDENT_AMBULATORY_CARE_PROVIDER_SITE_OTHER): Payer: BLUE CROSS/BLUE SHIELD | Admitting: Physician Assistant

## 2018-08-26 ENCOUNTER — Encounter: Payer: Self-pay | Admitting: Physician Assistant

## 2018-08-26 VITALS — BP 140/90 | HR 83 | Temp 98.3°F | Wt 179.0 lb

## 2018-08-26 DIAGNOSIS — Z23 Encounter for immunization: Secondary | ICD-10-CM

## 2018-08-26 DIAGNOSIS — J301 Allergic rhinitis due to pollen: Secondary | ICD-10-CM | POA: Diagnosis not present

## 2018-08-26 NOTE — Patient Instructions (Addendum)
Continue allergy medication and flonase 1 spray in the morning and 1 spray at bedtime Mucinex DM   Nasal Allergies Nasal allergies are a reaction to allergens in the air. Allergens are particles in the air that cause your body to have an allergic reaction. Nasal allergies are not passed from person to person (are not contagious). They cannot be cured, but they can be controlled. What are the causes? Seasonal nasal allergies (hay fever) are caused by pollen allergens that come from grasses, trees, and weeds. Year-round nasal allergies (perennial allergic rhinitis) are caused by allergens such as house dust mites, pet dander, and mold spores. What increases the risk? The following factors may make you more likely to develop this condition:  Having certain health conditions. These include: ? Other types of allergies, such as food allergies. ? Asthma. ? Eczema.  Having a close relative who has allergies or asthma.  Exposure to house dust, pollen, dander, or other allergens at home or at work.  Exposure to air pollution or secondhand smoke when you were a child.  What are the signs or symptoms? Symptoms of this condition include:  Sneezing.  Runny nose or stuffy nose (congestion).  Watery (tearing) eyes.  Itchy eyes, nose, mouth, throat, skin, or other area.  Sore throat.  Headache.  Decreased sense of smell or taste.  Fatigue. This may occur if you have trouble sleeping due to allergies.  Swollen eyelids.  How is this diagnosed? This condition is diagnosed with a medical history and physical exam. Allergy testing may be done to determine exactly what triggers your nasal allergies. How is this treated? There is no cure for nasal allergies. Treatment focuses on controlling your symptoms, and it may include:  Medicines that block allergy symptoms. These may include allergy shots, nasal sprays, and oral antihistamines.  Avoiding the allergen.  Follow these instructions at  home:  Avoid the allergen that is causing your symptoms, if possible.  Keep windows closed. If possible, use air conditioning when pollen counts are high.  Do not use fans in your home.  Do not hang clothes outside to dry.  Wear sunglasses to keep pollen out of your eyes.  Wash your hands right away after you touch household pets.  Take over-the-counter and prescription medicines only as told by your health care provider.  Keep all follow-up visits as told by your health care provider. This is important. Contact a health care provider if:  You have a fever.  You develop a cough that does not go away (is persistent).  You start to wheeze.  Your symptoms do not improve with treatment.  You have thick nasal discharge.  You start to have nosebleeds. Get help right away if:  Your tongue or your lips are swollen.  You have trouble breathing.  You feel light-headed or you feel like you are going to faint.  You have cold sweats. This information is not intended to replace advice given to you by your health care provider. Make sure you discuss any questions you have with your health care provider. Document Released: 11/09/2005 Document Revised: 04/13/2016 Document Reviewed: 05/22/2015 Elsevier Interactive Patient Education  [employer].

## 2018-08-26 NOTE — Progress Notes (Signed)
Patient: Caroline Bowen Female    DOB: 02/17/1961   57 y.o.   MRN: [id number] Visit Date: 08/26/2018  Today's Provider: Margaretann Loveless, PA-C   Chief Complaint  Patient presents with  . URI   Subjective:    URI   This is a new problem. Episode onset: Tuesday. The problem has been gradually worsening. There has been no fever. Associated symptoms include congestion, coughing, rhinorrhea and sneezing. Pertinent negatives include no ear pain, headaches, nausea or sore throat. Treatments tried: Flonase and Allergy medication. The treatment provided no relief.     Allergies  Allergen Reactions  . Metoprolol Swelling     Current Outpatient Medications:  .  COMBIPATCH 0.05-0.14 MG/DAY, PLACE 1 PATCH ONTO THE SKIN 2 (TWO) TIMES A WEEK., Disp: 8 patch, Rfl: 6 .  fluticasone (FLONASE) 50 MCG/ACT nasal spray, Place 2 sprays into both nostrils daily., Disp: 16 g, Rfl: 6 .  triamterene-hydrochlorothiazide (MAXZIDE-25) 37.5-25 MG tablet, TAKE 1 TABLET BY MOUTH DAILY, Disp: 30 tablet, Rfl: 6 .  vitamin C (ASCORBIC ACID) 500 MG tablet, Take 500 mg by mouth daily., Disp: , Rfl:   Review of Systems  Constitutional: Negative.   HENT: Positive for congestion, rhinorrhea and sneezing. Negative for ear pain and sore throat.   Respiratory: Positive for cough.   Cardiovascular: Negative.   Gastrointestinal: Negative.  Negative for nausea.  Musculoskeletal: Negative.   Neurological: Negative for headaches.    Social History   Tobacco Use  . Smoking status: Never Smoker  . Smokeless tobacco: Never Used  Substance Use Topics  . Alcohol use: No    Comment: very rare   Objective:   BP 140/90 (BP Location: Left Arm, Patient Position: Sitting, Cuff Size: Large)   Pulse 83   Temp 98.3 F (36.8 C) (Oral)   Wt 179 lb (81.2 kg)   SpO2 99%   BMI 29.79 kg/m    Vitals:   08/26/18 1050  BP: 140/90  Pulse: 83  Temp: 98.3 F (36.8 C)  TempSrc: Oral  SpO2: 99%  Weight: 179 lb (81.2 kg)       Physical Exam  Constitutional: She appears well-developed and well-nourished. No distress.  HENT:  Head: Normocephalic and atraumatic.  Right Ear: Hearing, tympanic membrane and external ear normal.  Left Ear: Hearing, tympanic membrane and external ear normal.  Nose: Nose normal.  Mouth/Throat: Oropharynx is clear and moist. No oropharyngeal exudate.  Eyes: Pupils are equal, round, and reactive to light. Conjunctivae and EOM are normal. Right eye exhibits no discharge. Left eye exhibits no discharge.  Neck: Normal range of motion. Neck supple. No JVD present. No tracheal deviation present. No Brudzinski's sign and no Kernig's sign noted. No thyromegaly present.  Cardiovascular: Normal rate, regular rhythm and normal heart sounds. Exam reveals no gallop and no friction rub.  No murmur heard. Pulmonary/Chest: Effort normal and breath sounds normal. No stridor. No respiratory distress. She has no wheezes. She has no rales. She exhibits no tenderness.  Lymphadenopathy:    She has no cervical adenopathy.  Skin: Skin is warm and dry.       Assessment & Plan:     1. Seasonal allergic rhinitis due to pollen Continue flonase and zyrtec. Add mucinex DM. Push fluids. Call if symptoms worsen.   2. Need for influenza vaccination Flu vaccine given today without complication. Patient sat upright for 15 minutes to check for adverse reaction before being released. - Flu Vaccine QUAD 36+ mos IM  Mar Daring, PA-C  [facility]

## 2018-09-20 ENCOUNTER — Other Ambulatory Visit: Payer: Self-pay | Admitting: Family Medicine

## 2018-11-28 ENCOUNTER — Encounter: Payer: Self-pay | Admitting: Family Medicine

## 2018-11-28 ENCOUNTER — Ambulatory Visit (INDEPENDENT_AMBULATORY_CARE_PROVIDER_SITE_OTHER): Payer: BLUE CROSS/BLUE SHIELD | Admitting: Family Medicine

## 2018-11-28 VITALS — BP 138/86 | HR 79 | Temp 98.3°F | Ht 65.0 in | Wt 177.2 lb

## 2018-11-28 DIAGNOSIS — Z13220 Encounter for screening for lipoid disorders: Secondary | ICD-10-CM

## 2018-11-28 DIAGNOSIS — Z1239 Encounter for other screening for malignant neoplasm of breast: Secondary | ICD-10-CM | POA: Diagnosis not present

## 2018-11-28 DIAGNOSIS — E663 Overweight: Secondary | ICD-10-CM

## 2018-11-28 DIAGNOSIS — I10 Essential (primary) hypertension: Secondary | ICD-10-CM

## 2018-11-28 DIAGNOSIS — Z0000 Encounter for general adult medical examination without abnormal findings: Secondary | ICD-10-CM | POA: Diagnosis not present

## 2018-11-28 DIAGNOSIS — D649 Anemia, unspecified: Secondary | ICD-10-CM | POA: Diagnosis not present

## 2018-11-28 MED ORDER — TRIAMTERENE-HCTZ 37.5-25 MG PO TABS: 0.5000 | ORAL_TABLET | Freq: Every day | ORAL | 3 refills | Status: DC

## 2018-11-28 NOTE — Progress Notes (Signed)
Patient: Caroline Bowen, Female    DOB: [birth year], 58 y.o.   MRN: [id number] Visit Date: 11/28/2018  Today's Provider: Shirlee Latch, MD   Chief Complaint  Patient presents with  . Annual Exam   Subjective:  I, Presley Raddle, CMA, am acting as a scribe for Shirlee Latch, MD.    Annual physical exam Caroline Bowen is a 58 y.o. female who presents today for health maintenance and complete physical. She feels well. She reports exercising lightly. She reports she is sleeping well.  Last pap smear - 10/2016 - NIL negative HPV Last mammogram 12/20/17 BIRADs0 - diagnostic R mammo - BIRADS1 01/03/18 - repeat in 1 yr Last colonoscopy 12/30/12 - Normal - repeat in 10 yrs -----------------------------------------------------------------   Review of Systems  Constitutional: Negative.   HENT: Negative.   Eyes: Negative.   Respiratory: Negative.   Cardiovascular: Negative.   Gastrointestinal: Negative.   Endocrine: Negative.   Genitourinary: Negative.   Musculoskeletal: Negative.   Skin: Negative.   Allergic/Immunologic: Negative.   Neurological: Negative.   Hematological: Negative.   Psychiatric/Behavioral: Negative.     Social History      She  reports that she has never smoked. She has never used smokeless tobacco. She reports that she does not drink alcohol or use drugs.       Social History   Socioeconomic History  . Marital status: Married    Spouse name: Trinna Post  . Number of children: 1  . Years of education: 57  . Highest education level: Bachelor's degree (e.g., BA, AB, BS)  Occupational History  . Not on file  Social Needs  . Financial resource strain: Not hard at all  . Food insecurity:    Worry: Never true    Inability: Never true  . Transportation needs:    Medical: No    Non-medical: No  Tobacco Use  . Smoking status: Never Smoker  . Smokeless tobacco: Never Used  Substance and Sexual Activity  . Alcohol use: No    Comment: very rare  . Drug  use: No  . Sexual activity: Yes    Birth control/protection: Post-menopausal  Lifestyle  . Physical activity:    Days per week: 3 days    Minutes per session: 20 min  . Stress: Not on file  Relationships  . Social connections:    Talks on phone: Not on file    Gets together: Not on file    Attends religious service: Not on file    Active member of club or organization: Not on file    Attends meetings of clubs or organizations: Not on file    Relationship status: Not on file  Other Topics Concern  . Not on file  Social History Narrative  . Not on file    Past Medical History:  Diagnosis Date  . Anemia   . Hypertension      Patient Active Problem List   Diagnosis Date Noted  . Menopausal disorder 06/05/2015  . Absolute anemia 05/24/2015  . Accumulation of fluid in tissues 05/24/2015  . Abnormal finding on breast imaging 04/16/2010  . Essential hypertension 09/06/2008  . Headache disorder 09/06/2008    Past Surgical History:  Procedure Laterality Date  . CARDIAC CATHETERIZATION  06/18/2016   Normal coronaries. Normal EF=60%. Dr. Welton Flakes  . CYST EXCISION  2001   wrist  . wrist surgery  2001    Family History        Family Status  Relation Name Status  . Mother  Alive  . Father  Deceased  . Sister  Alive  . Brother  Alive  . Sister  Alive  . Brother  Deceased  . Brother  Alive  . Neg Hx  (Not Specified)        Her family history includes Cancer in her mother; Diabetes in her father and sister; Heart failure in her father; Hypertension in her brother, brother, father, mother, sister, and sister; Kidney disease in her father; Ovarian cancer in her mother; Pancreatic cancer in her brother; Prostate cancer in her father; Stroke in her brother. There is no history of Breast cancer or Colon cancer.      Allergies  Allergen Reactions  . Metoprolol Swelling     Current Outpatient Medications:  .  COMBIPATCH 0.05-0.14 MG/DAY, PLACE 1 PATCH ONTO THE SKIN 2 (TWO)  TIMES A WEEK., Disp: 8 patch, Rfl: 6 .  fluticasone (FLONASE) 50 MCG/ACT nasal spray, SPRAY 2 SPRAYS INTO EACH NOSTRIL EVERY DAY, Disp: 16 g, Rfl: 6 .  triamterene-hydrochlorothiazide (MAXZIDE-25) 37.5-25 MG tablet, Take 0.5 tablets by mouth daily., Disp: 45 tablet, Rfl: 3 .  vitamin C (ASCORBIC ACID) 500 MG tablet, Take 500 mg by mouth daily., Disp: , Rfl:    Patient Care Team: Erasmo DownerBacigalupo,  M, MD as PCP - General (Family Medicine)      Objective:   Vitals: BP 138/86 (BP Location: Right Arm, Patient Position: Sitting, Cuff Size: Normal)   Pulse 79   Temp 98.3 F (36.8 C) (Oral)   Ht 5\' 5"  (1.651 m)   Wt 177 lb 3.2 oz (80.4 kg)   SpO2 96%   BMI 29.49 kg/m    Vitals:   11/28/18 0905  BP: 138/86  Pulse: 79  Temp: 98.3 F (36.8 C)  TempSrc: Oral  SpO2: 96%  Weight: 177 lb 3.2 oz (80.4 kg)  Height: 5\' 5"  (1.651 m)     Physical Exam Vitals signs reviewed.  Constitutional:      General: She is not in acute distress.    Appearance: Normal appearance. She is well-developed. She is not diaphoretic.  HENT:     Head: Normocephalic and atraumatic.     Right Ear: Tympanic membrane, ear canal and external ear normal.     Left Ear: Tympanic membrane, ear canal and external ear normal.     Nose: Nose normal.     Mouth/Throat:     Mouth: Mucous membranes are moist.     Pharynx: Oropharynx is clear. No oropharyngeal exudate.  Eyes:     General: No scleral icterus.    Conjunctiva/sclera: Conjunctivae normal.     Pupils: Pupils are equal, round, and reactive to light.  Neck:     Musculoskeletal: Neck supple.     Thyroid: No thyromegaly.  Cardiovascular:     Rate and Rhythm: Normal rate and regular rhythm.     Pulses: Normal pulses.     Heart sounds: Normal heart sounds. No murmur.  Pulmonary:     Effort: Pulmonary effort is normal. No respiratory distress.     Breath sounds: Normal breath sounds. No wheezing or rales.  Abdominal:     General: Bowel sounds are normal.  There is no distension.     Palpations: Abdomen is soft.     Tenderness: There is no abdominal tenderness. There is no guarding or rebound.  Musculoskeletal:        General: No deformity.     Right lower leg: No edema.  Left lower leg: No edema.  Lymphadenopathy:     Cervical: No cervical adenopathy.  Skin:    General: Skin is warm and dry.     Capillary Refill: Capillary refill takes less than 2 seconds.     Findings: No rash.  Neurological:     Mental Status: She is alert and oriented to person, place, and time. Mental status is at baseline.  Psychiatric:        Mood and Affect: Mood normal.        Behavior: Behavior normal.        Thought Content: Thought content normal.      Depression Screen PHQ 2/9 Scores 11/28/2018 11/26/2017 09/09/2015  PHQ - 2 Score 0 1 0  PHQ- 9 Score 0 - -      Assessment & Plan:     Routine Health Maintenance and Physical Exam  Exercise Activities and Dietary recommendations Goals    . Exercise 150 minutes per week (moderate activity)       Immunization History  Administered Date(s) Administered  . Influenza,inj,Quad PF,6+ Mos 09/29/2013, 09/09/2015, 11/19/2016, 08/27/2017, 08/26/2018  . Tdap 11/26/2017    Health Maintenance  Topic Date Due  . MAMMOGRAM  12/21/2019  . PAP SMEAR-Modifier  11/19/2021  . COLONOSCOPY  12/30/2022  . TETANUS/TDAP  11/27/2027  . INFLUENZA VACCINE  Completed  . Hepatitis C Screening  Completed  . HIV Screening  Completed     Discussed health benefits of physical activity, and encouraged her to engage in regular exercise appropriate for her age and condition.    --------------------------------------------------------------------  Problem List Items Addressed This Visit      Cardiovascular and Mediastinum   Essential hypertension   Relevant Medications   triamterene-hydrochlorothiazide (MAXZIDE-25) 37.5-25 MG tablet   Other Relevant Orders   Comprehensive metabolic panel   Lipid panel      Other   Absolute anemia   Relevant Orders   CBC    Other Visit Diagnoses    Encounter for annual physical exam    -  Primary   Relevant Orders   Comprehensive metabolic panel   Lipid panel   CBC   Screening for breast cancer       Relevant Orders   MM 3D SCREEN BREAST BILATERAL   Benign hypertension       Relevant Medications   triamterene-hydrochlorothiazide (MAXZIDE-25) 37.5-25 MG tablet   Screening for lipid disorders       Relevant Orders   Lipid panel   Overweight           Return in about 6 months (around 05/29/2019) for chronic disease f/u and 1 yr for CPE.   The entirety of the information documented in the History of Present Illness, Review of Systems and Physical Exam were personally obtained by me. Portions of this information were initially documented by Presley Raddle, CMA and reviewed by me for thoroughness and accuracy.    Erasmo Downer, MD, MPH [facility] 11/28/2018 10:31 AM

## 2018-11-28 NOTE — Patient Instructions (Addendum)
Labs at [facility] after 12/06/2018 - fasting     Preventive Care 40-64 Years, Female Preventive care refers to lifestyle choices and visits with your health care provider that can promote health and wellness. What does preventive care include?   A yearly physical exam. This is also called an annual well check.  Dental exams once or twice a year.  Routine eye exams. Ask your health care provider how often you should have your eyes checked.  Personal lifestyle choices, including: ? Daily care of your teeth and gums. ? Regular physical activity. ? Eating a healthy diet. ? Avoiding tobacco and drug use. ? Limiting alcohol use. ? Practicing safe sex. ? Taking low-dose aspirin daily starting at age 44. ? Taking vitamin and mineral supplements as recommended by your health care provider. What happens during an annual well check? The services and screenings done by your health care provider during your annual well check will depend on your age, overall health, lifestyle risk factors, and family history of disease. Counseling Your health care provider may ask you questions about your:  Alcohol use.  Tobacco use.  Drug use.  Emotional well-being.  Home and relationship well-being.  Sexual activity.  Eating habits.  Work and work Statistician.  Method of birth control.  Menstrual cycle.  Pregnancy history. Screening You may have the following tests or measurements:  Height, weight, and BMI.  Blood pressure.  Lipid and cholesterol levels. These may be checked every 5 years, or more frequently if you are over 84 years old.  Skin check.  Lung cancer screening. You may have this screening every year starting at age 37 if you have a 30-pack-year history of smoking and currently smoke or have quit within the past 15 years.  Colorectal cancer screening. All adults should have this screening starting at age 43 and continuing until age 49. Your health care provider may  recommend screening at age 36. You will have tests every 1-10 years, depending on your results and the type of screening test. People at increased risk should start screening at an earlier age. Screening tests may include: ? Guaiac-based fecal occult blood testing. ? Fecal immunochemical test (FIT). ? Stool DNA test. ? Virtual colonoscopy. ? Sigmoidoscopy. During this test, a flexible tube with a tiny camera (sigmoidoscope) is used to examine your rectum and lower colon. The sigmoidoscope is inserted through your anus into your rectum and lower colon. ? Colonoscopy. During this test, a long, thin, flexible tube with a tiny camera (colonoscope) is used to examine your entire colon and rectum.  Hepatitis C blood test.  Hepatitis B blood test.  Sexually transmitted disease (STD) testing.  Diabetes screening. This is done by checking your blood sugar (glucose) after you have not eaten for a while (fasting). You may have this done every 1-3 years.  Mammogram. This may be done every 1-2 years. Talk to your health care provider about when you should start having regular mammograms. This may depend on whether you have a family history of breast cancer.  BRCA-related cancer screening. This may be done if you have a family history of breast, ovarian, tubal, or peritoneal cancers.  Pelvic exam and Pap test. This may be done every 3 years starting at age 12. Starting at age 30, this may be done every 5 years if you have a Pap test in combination with an HPV test.  Bone density scan. This is done to screen for osteoporosis. You may have this scan if you are  at high risk for osteoporosis. Discuss your test results, treatment options, and if necessary, the need for more tests with your health care provider. Vaccines Your health care provider may recommend certain vaccines, such as:  Influenza vaccine. This is recommended every year.  Tetanus, diphtheria, and acellular pertussis (Tdap, Td) vaccine. You  may need a Td booster every 10 years.  Varicella vaccine. You may need this if you have not been vaccinated.  Zoster vaccine. You may need this after age 62.  Measles, mumps, and rubella (MMR) vaccine. You may need at least one dose of MMR if you were born in 1957 or later. You may also need a second dose.  Pneumococcal 13-valent conjugate (PCV13) vaccine. You may need this if you have certain conditions and were not previously vaccinated.  Pneumococcal polysaccharide (PPSV23) vaccine. You may need one or two doses if you smoke cigarettes or if you have certain conditions.  Meningococcal vaccine. You may need this if you have certain conditions.  Hepatitis A vaccine. You may need this if you have certain conditions or if you travel or work in places where you may be exposed to hepatitis A.  Hepatitis B vaccine. You may need this if you have certain conditions or if you travel or work in places where you may be exposed to hepatitis B.  Haemophilus influenzae type b (Hib) vaccine. You may need this if you have certain conditions. Talk to your health care provider about which screenings and vaccines you need and how often you need them. This information is not intended to replace advice given to you by your health care provider. Make sure you discuss any questions you have with your health care provider. Document Released: 12/06/2015 Document Revised: 12/30/2017 Document Reviewed: 09/10/2015 Elsevier Interactive Patient Education  2019 [employer].

## 2018-12-19 DIAGNOSIS — D649 Anemia, unspecified: Secondary | ICD-10-CM | POA: Diagnosis not present

## 2018-12-19 DIAGNOSIS — Z0000 Encounter for general adult medical examination without abnormal findings: Secondary | ICD-10-CM | POA: Diagnosis not present

## 2018-12-19 DIAGNOSIS — Z13220 Encounter for screening for lipoid disorders: Secondary | ICD-10-CM | POA: Diagnosis not present

## 2018-12-19 DIAGNOSIS — I10 Essential (primary) hypertension: Secondary | ICD-10-CM | POA: Diagnosis not present

## 2018-12-20 ENCOUNTER — Telehealth: Payer: Self-pay

## 2018-12-20 LAB — COMPREHENSIVE METABOLIC PANEL
ALK PHOS: 82 IU/L (ref 39–117)
ALT: 15 IU/L (ref 0–32)
AST: 21 IU/L (ref 0–40)
Albumin/Globulin Ratio: 1.5 (ref 1.2–2.2)
Albumin: 4.4 g/dL (ref 3.8–4.9)
BUN/Creatinine Ratio: 13 (ref 9–23)
BUN: 15 mg/dL (ref 6–24)
Bilirubin Total: 0.3 mg/dL (ref 0.0–1.2)
CO2: 25 mmol/L (ref 20–29)
Calcium: 9.8 mg/dL (ref 8.7–10.2)
Chloride: 102 mmol/L (ref 96–106)
Creatinine, Ser: 1.16 mg/dL — ABNORMAL HIGH (ref 0.57–1.00)
GFR calc Af Amer: 60 mL/min/{1.73_m2} (ref >59)
GFR calc non Af Amer: 52 mL/min/{1.73_m2} — ABNORMAL LOW (ref >59)
Globulin, Total: 2.9 g/dL (ref 1.5–4.5)
Glucose: 89 mg/dL (ref 65–99)
POTASSIUM: 4.3 mmol/L (ref 3.5–5.2)
Sodium: 142 mmol/L (ref 134–144)
Total Protein: 7.3 g/dL (ref 6.0–8.5)

## 2018-12-20 LAB — CBC
HEMATOCRIT: 38.3 % (ref 34.0–46.6)
Hemoglobin: 13 g/dL (ref 11.1–15.9)
MCH: 32.1 pg (ref 26.6–33.0)
MCHC: 33.9 g/dL (ref 31.5–35.7)
MCV: 95 fL (ref 79–97)
Platelets: 236 10*3/uL (ref 150–450)
RBC: 4.05 x10E6/uL (ref 3.77–5.28)
RDW: 12.4 % (ref 11.7–15.4)
WBC: 5.6 10*3/uL (ref 3.4–10.8)

## 2018-12-20 LAB — LIPID PANEL
Chol/HDL Ratio: 3.2 ratio (ref 0.0–4.4)
Cholesterol, Total: 218 mg/dL — ABNORMAL HIGH (ref 100–199)
HDL: 69 mg/dL (ref >39)
LDL Calculated: 132 mg/dL — ABNORMAL HIGH (ref 0–99)
Triglycerides: 86 mg/dL (ref 0–149)
VLDL Cholesterol Cal: 17 mg/dL (ref 5–40)

## 2018-12-20 NOTE — Telephone Encounter (Signed)
Attempted to contact patient, no answer and voicemail is full.  

## 2018-12-20 NOTE — Telephone Encounter (Signed)
-----   Message from Erasmo Downer, MD sent at 12/20/2018 11:34 AM EST ----- Normal liver function and electrolytes, blood counts.  Stable kidney function.  Cholesterol is stable and slightly high, but 10 year risk of heart disease/stroke is low at 4.1%. No need for medications.  Decrease saturated fat in diet and exercise regularly.

## 2018-12-21 NOTE — Telephone Encounter (Signed)
Pt returned missed call.  Please call pt back any time for the rest of today.  Thanks, [employer]

## 2018-12-23 NOTE — Telephone Encounter (Signed)
Attempted to contact patient, no answer and voicemail is full.  

## 2018-12-23 NOTE — Telephone Encounter (Signed)
Patient returned a call to [place].

## 2018-12-24 ENCOUNTER — Other Ambulatory Visit: Payer: Self-pay | Admitting: Family Medicine

## 2018-12-26 NOTE — Telephone Encounter (Signed)
Patient advised.

## 2019-01-05 ENCOUNTER — Ambulatory Visit
Admission: RE | Admit: 2019-01-05 | Discharge: 2019-01-05 | Disposition: A | Discharge status: Home / Self Care | Payer: BLUE CROSS/BLUE SHIELD | Source: Ambulatory Visit | Attending: Family Medicine | Admitting: Family Medicine

## 2019-01-05 DIAGNOSIS — Z1231 Encounter for screening mammogram for malignant neoplasm of breast: Secondary | ICD-10-CM | POA: Insufficient documentation

## 2019-01-05 DIAGNOSIS — Z1239 Encounter for other screening for malignant neoplasm of breast: Secondary | ICD-10-CM

## 2019-05-29 ENCOUNTER — Other Ambulatory Visit: Payer: Self-pay

## 2019-05-29 ENCOUNTER — Encounter: Payer: Self-pay | Admitting: Family Medicine

## 2019-05-29 ENCOUNTER — Ambulatory Visit: Payer: BC Managed Care – PPO | Admitting: Family Medicine

## 2019-05-29 VITALS — BP 134/80 | HR 66 | Temp 98.6°F | Wt 180.8 lb

## 2019-05-29 DIAGNOSIS — I10 Essential (primary) hypertension: Secondary | ICD-10-CM | POA: Diagnosis not present

## 2019-05-29 DIAGNOSIS — T63441A Toxic effect of venom of bees, accidental (unintentional), initial encounter: Secondary | ICD-10-CM | POA: Diagnosis not present

## 2019-05-29 DIAGNOSIS — E785 Hyperlipidemia, unspecified: Secondary | ICD-10-CM | POA: Insufficient documentation

## 2019-05-29 DIAGNOSIS — E782 Mixed hyperlipidemia: Secondary | ICD-10-CM

## 2019-05-29 NOTE — Patient Instructions (Signed)
Bee, Wasp, or [employer], Adult Bees, wasps, and hornets are part of a family of insects that can sting people. These stings can cause pain and inflammation, but they are usually not serious. However, some people may have an allergic reaction to a sting. This can cause the symptoms to be more severe. What increases the risk? You may be at a greater risk of getting stung if you:  Provoke a stinging insect by swatting or disturbing it.  Wear strong-smelling soaps, deodorants, or body sprays.  Spend time outdoors near gardens with flowers or fruit trees or in clothes that expose skin.  Eat or drink outside. What are the signs or symptoms? Common symptoms of this condition include:  A red lump in the skin that sometimes has a tiny hole in the center. In some cases, a stinger may be in the center of the wound.  Pain and itching at the sting site.  Redness and swelling around the sting site. If you have an allergic reaction (localized allergic reaction), the swelling and redness may spread out from the sting site. In some cases, this reaction can continue to develop over the next 24-48 hours. In rare cases, a person may have a severe allergic reaction (anaphylactic reaction) to a sting. Symptoms of an anaphylactic reaction may include:  Wheezing or difficulty breathing.  Raised, itchy, red patches on the skin (hives).  Nausea or vomiting.  Abdominal cramping.  Diarrhea.  Tightness in the chest or chest pain.  Dizziness or fainting.  Redness of the face (flushing).  Hoarse voice.  Swollen tongue, lips, or face. How is this diagnosed? This condition is usually diagnosed based on your symptoms and medical history as well as a physical exam. You may have an allergy test to determine if you are allergic to the substance that the insect injected during the sting (venom). How is this treated? If you were stung by a bee, the stinger and a small sac of venom may be in the wound. It is  important to remove the stinger as soon as possible. You can do this by brushing across the wound with gauze, a fingernail, or a flat card such as a credit card. Removing the stinger can help reduce the severity of your body's reaction to the sting. Most stings can be treated with:  Icing to reduce swelling in the area.  Medicines (antihistamines) to treat itching or an allergic reaction.  Medicines to help reduce pain. These may be medicines that you take by mouth, or medicated creams or lotions that you apply to your skin. Pay close attention to your symptoms after you have been stung. If possible, have someone stay with you to make sure you do not have an allergic reaction. If you have any signs of an allergic reaction, call your health care provider. If you have ever had a severe allergic reaction, your health care provider may give you an inhaler or injectable medicine (epinephrine auto-injector) to use if necessary. Follow these instructions at home:   Wash the sting site 2-3 times each day with soap and water as told by your health care provider.  Apply or take over-the-counter and prescription medicines only as told by your health care provider.  If directed, apply ice to the sting area. ? Put ice in a plastic bag. ? Place a towel between your skin and the bag. ? Leave the ice on for 20 minutes, 2-3 times a day.  Do not scratch the sting area.  If  you had a severe allergic reaction to a sting, you may need: ? To wear a medical bracelet or necklace that lists the allergy. ? To learn when and how to use an anaphylaxis kit or epinephrine injection. Your family members and coworkers may also need to learn this. ? To carry an anaphylaxis kit or epinephrine injection with you at all times. How is this prevented?  Avoid swatting at stinging insects and disturbing insect nests.  Do not use fragrant soaps or lotions.  Wear shoes, pants, and long sleeves when spending time outdoors,  especially in grassy areas where stinging insects are common.  Keep outdoor areas free from nests or hives.  Keep food and drink containers covered when eating outdoors.  Avoid working or sitting near flowering plants, if possible.  Wear gloves if you are gardening or working outdoors.  If an attack by a stinging insect or a swarm seems likely in the moment, move away from the area or find a barrier between you and the insect(s), such as a door. Contact a health care provider if:  Your symptoms do not get better in 2-3 days.  You have redness, swelling, or pain that spreads beyond the area of the sting.  You have a fever. Get help right away if: You have symptoms of a severe allergic reaction. These include:  Wheezing or difficulty breathing.  Tightness in the chest or chest pain.  Light-headedness or fainting.  Itchy, raised, red patches on the skin.  Nausea or vomiting.  Abdominal cramping.  Diarrhea.  A swollen tongue or lips, or trouble swallowing.  Dizziness or fainting. Summary  Stings from bees, wasps, and hornets can cause pain and inflammation, but they are usually not serious. However, some people may have an allergic reaction to a sting. This can cause the symptoms to be more severe.  Pay close attention to your symptoms after you have been stung. If possible, have someone stay with you to make sure you do not have an allergic reaction.  Call your health care provider if you have any signs of an allergic reaction. This information is not intended to replace advice given to you by your health care provider. Make sure you discuss any questions you have with your health care provider. Document Released: 11/09/2005 Document Revised: 11/04/2017 Document Reviewed: 01/14/2017 Elsevier Patient Education  2020 [employer] Inc[employer]  

## 2019-05-29 NOTE — Assessment & Plan Note (Signed)
Well controlled Continue current medications Recheck metabolic panel F/u in 6 months  

## 2019-05-29 NOTE — Assessment & Plan Note (Signed)
Previously elevated, but not on statin Will repeat FLP and calculate ASCVD risk to determine need ofr statin Discussed diet and exercise

## 2019-05-29 NOTE — Progress Notes (Signed)
Patient: Caroline Bowen Female    DOB: 03/12/1961   58 y.o.   MRN: [id number] Visit Date: 05/29/2019  Today's Provider: Shirlee LatchAngela Bacigalupo, MD   Chief Complaint  Patient presents with  . Hypertension  . Hyperlipidemia   Subjective:    I, Porsha McClurkin CMA, am acting as a scribe for Shirlee LatchAngela Bacigalupo, MD.   HPI   Hypertension, follow-up:  BP Readings from Last 3 Encounters:  05/29/19 134/80  11/28/18 138/86  08/26/18 140/90    She was last seen for hypertension 6 months ago.  BP at that visit was 138/86. Management since that visit includes none.She reports good compliance with treatment. She is not having side effects.  She is exercising. She is not adherent to low salt diet.   Outside blood pressures are not checked. She is experiencing none.  Patient denies chest pain, chest pressure/discomfort, exertional chest pressure/discomfort, fatigue, irregular heart beat, lower extremity edema and palpitations.   Cardiovascular risk factors include hypertension.  Use of agents associated with hypertension: none.   ------------------------------------------------------------------------    Lipid/Cholesterol, Follow-up:   Last seen for this 6 months ago.  Management since that visit includes none.  Last Lipid Panel:    Component Value Date/Time   CHOL 218 (H) 12/19/2018 0825   TRIG 86 12/19/2018 0825   HDL 69 12/19/2018 0825   CHOLHDL 3.2 12/19/2018 0825   LDLCALC 132 (H) 12/19/2018 0825    She reports good compliance with treatment. She is not having side effects.   Wt Readings from Last 3 Encounters:  05/29/19 180 lb 12.8 oz (82 kg)  11/28/18 177 lb 3.2 oz (80.4 kg)  08/26/18 179 lb (81.2 kg)    ------------------------------------------------------------------------  Yellow jacket sting 2 days ago.  Ongoing swelling on L ankle. No erythema  Allergies  Allergen Reactions  . Metoprolol Swelling     Current Outpatient Medications:  .   COMBIPATCH 0.05-0.14 MG/DAY, PLACE 1 PATCH ONTO THE SKIN 2 (TWO) TIMES A WEEK., Disp: 8 patch, Rfl: 6 .  fluticasone (FLONASE) 50 MCG/ACT nasal spray, SPRAY 2 SPRAYS INTO EACH NOSTRIL EVERY DAY, Disp: 16 g, Rfl: 6 .  triamterene-hydrochlorothiazide (MAXZIDE-25) 37.5-25 MG tablet, Take 0.5 tablets by mouth daily., Disp: 45 tablet, Rfl: 3 .  vitamin C (ASCORBIC ACID) 500 MG tablet, Take 500 mg by mouth daily., Disp: , Rfl:   Review of Systems  Constitutional: Negative.   Respiratory: Negative.   Genitourinary: Negative.   Neurological: Negative.     Social History   Tobacco Use  . Smoking status: Never Smoker  . Smokeless tobacco: Never Used  Substance Use Topics  . Alcohol use: No    Comment: very rare      Objective:   BP 134/80 (BP Location: Left Arm, Patient Position: Sitting, Cuff Size: Normal)   Pulse 66   Temp 98.6 F (37 C) (Oral)   Wt 180 lb 12.8 oz (82 kg)   SpO2 97%   BMI 30.09 kg/m  Vitals:   05/29/19 0825  BP: 134/80  Pulse: 66  Temp: 98.6 F (37 C)  TempSrc: Oral  SpO2: 97%  Weight: 180 lb 12.8 oz (82 kg)     Physical Exam Vitals signs reviewed.  Constitutional:      General: She is not in acute distress.    Appearance: Normal appearance. She is well-developed. She is not diaphoretic.  HENT:     Head: Normocephalic and atraumatic.  Eyes:     General:  No scleral icterus.    Conjunctiva/sclera: Conjunctivae normal.  Neck:     Musculoskeletal: Neck supple.     Thyroid: No thyromegaly.  Cardiovascular:     Rate and Rhythm: Normal rate and regular rhythm.     Pulses: Normal pulses.     Heart sounds: Normal heart sounds. No murmur.  Pulmonary:     Effort: Pulmonary effort is normal. No respiratory distress.     Breath sounds: Normal breath sounds. No wheezing, rhonchi or rales.  Musculoskeletal:     Right lower leg: No edema.     Left lower leg: No edema.     Comments: Mild swelling on L medial ankle No surrounding erythema  Lymphadenopathy:      Cervical: No cervical adenopathy.  Skin:    General: Skin is warm and dry.     Capillary Refill: Capillary refill takes less than 2 seconds.     Findings: No rash.  Neurological:     Mental Status: She is alert and oriented to person, place, and time. Mental status is at baseline.  Psychiatric:        Mood and Affect: Mood normal.        Behavior: Behavior normal.      No results found for any visits on 05/29/19.     Assessment & Plan   Problem List Items Addressed This Visit      Cardiovascular and Mediastinum   Essential hypertension - Primary    Well controlled Continue current medications Recheck metabolic panel F/u in 6 months      Relevant Orders   Comprehensive metabolic panel     Other   Hyperlipidemia    Previously elevated, but not on statin Will repeat FLP and calculate ASCVD risk to determine need ofr statin Discussed diet and exercise      Relevant Orders   Comprehensive metabolic panel   Lipid panel    Other Visit Diagnoses    Bee sting reaction, accidental or unintentional, initial encounter        - discussed symptomatic management with hydrocortisone, benadryl and icing - discussed signs of infection and return precautions   Return in about 6 months (around 11/29/2019) for CPE.   The entirety of the information documented in the History of Present Illness, Review of Systems and Physical Exam were personally obtained by me. Portions of this information were initially documented by [facility] , CMA and reviewed by me for thoroughness and accuracy.    Bacigalupo, Dionne Bucy, MD MPH [facility]

## 2019-05-30 LAB — COMPREHENSIVE METABOLIC PANEL
ALT: 11 IU/L (ref 0–32)
AST: 20 IU/L (ref 0–40)
Albumin/Globulin Ratio: 1.5 (ref 1.2–2.2)
Albumin: 4.1 g/dL (ref 3.8–4.9)
Alkaline Phosphatase: 67 IU/L (ref 39–117)
BUN/Creatinine Ratio: 12 (ref 9–23)
BUN: 14 mg/dL (ref 6–24)
Bilirubin Total: 0.4 mg/dL (ref 0.0–1.2)
CO2: 24 mmol/L (ref 20–29)
Calcium: 9.2 mg/dL (ref 8.7–10.2)
Chloride: 104 mmol/L (ref 96–106)
Creatinine, Ser: 1.17 mg/dL — ABNORMAL HIGH (ref 0.57–1.00)
GFR calc Af Amer: 59 mL/min/{1.73_m2} — ABNORMAL LOW (ref >59)
GFR calc non Af Amer: 51 mL/min/{1.73_m2} — ABNORMAL LOW (ref >59)
Globulin, Total: 2.7 g/dL (ref 1.5–4.5)
Glucose: 85 mg/dL (ref 65–99)
Potassium: 3.6 mmol/L (ref 3.5–5.2)
Sodium: 143 mmol/L (ref 134–144)
Total Protein: 6.8 g/dL (ref 6.0–8.5)

## 2019-05-30 LAB — LIPID PANEL
Chol/HDL Ratio: 3.5 ratio (ref 0.0–4.4)
Cholesterol, Total: 193 mg/dL (ref 100–199)
HDL: 55 mg/dL (ref >39)
LDL Calculated: 120 mg/dL — ABNORMAL HIGH (ref 0–99)
Triglycerides: 90 mg/dL (ref 0–149)
VLDL Cholesterol Cal: 18 mg/dL (ref 5–40)

## 2019-06-02 ENCOUNTER — Telehealth: Payer: Self-pay

## 2019-06-02 NOTE — Telephone Encounter (Signed)
-----   Message from Virginia Crews, MD sent at 06/02/2019 12:53 PM EDT ----- Labs are stable. Cholesterol is slightly better than last year

## 2019-06-02 NOTE — Telephone Encounter (Signed)
Patient was advised.  

## 2019-06-05 ENCOUNTER — Ambulatory Visit: Payer: BC Managed Care – PPO | Admitting: Family Medicine

## 2019-06-05 ENCOUNTER — Other Ambulatory Visit: Payer: Self-pay

## 2019-06-05 VITALS — BP 156/82 | HR 82 | Temp 98.9°F | Wt 181.0 lb

## 2019-06-05 DIAGNOSIS — L255 Unspecified contact dermatitis due to plants, except food: Secondary | ICD-10-CM | POA: Diagnosis not present

## 2019-06-05 MED ORDER — PREDNISONE 5 MG PO TABS: ORAL_TABLET | ORAL | 0 refills | Status: DC

## 2019-06-05 NOTE — Progress Notes (Signed)
Caroline SalkDiane B Bowen  MRN: [id number] DOB: [birth year]  Subjective:  HPI   The patient is a 58 year old female who presents with spots of poison ivy.  She has been doing some work in the yard this past week.  She states she has 2 little spots on her wrist, one on her upper outer thigh, a couple on the inner thighs and one on her back.  Patient Active Problem List   Diagnosis Date Noted  . Hyperlipidemia 05/29/2019  . Menopausal disorder 06/05/2015  . Absolute anemia 05/24/2015  . Accumulation of fluid in tissues 05/24/2015  . Abnormal finding on breast imaging 04/16/2010  . Essential hypertension 09/06/2008  . Headache disorder 09/06/2008   Past Medical History:  Diagnosis Date  . Anemia   . Hypertension    Social History   Socioeconomic History  . Marital status: Married    Spouse name: Trinna Postlex  . Number of children: 1  . Years of education: 1116  . Highest education level: Bachelor's degree (e.g., BA, AB, BS)  Occupational History  . Not on file  Social Needs  . Financial resource strain: Not hard at all  . Food insecurity    Worry: Never true    Inability: Never true  . Transportation needs    Medical: No    Non-medical: No  Tobacco Use  . Smoking status: Never Smoker  . Smokeless tobacco: Never Used  Substance and Sexual Activity  . Alcohol use: No    Comment: very rare  . Drug use: No  . Sexual activity: Yes    Birth control/protection: Post-menopausal  Lifestyle  . Physical activity    Days per week: 3 days    Minutes per session: 20 min  . Stress: Not on file  Relationships  . Social Musicianconnections    Talks on phone: Not on file    Gets together: Not on file    Attends religious service: Not on file    Active member of club or organization: Not on file    Attends meetings of clubs or organizations: Not on file    Relationship status: Not on file  . Intimate partner violence    Fear of current or ex partner: Not on file    Emotionally abused: Not on file   Physically abused: Not on file    Forced sexual activity: Not on file  Other Topics Concern  . Not on file  Social History Narrative  . Not on file   Past Surgical History:  Procedure Laterality Date  . CARDIAC CATHETERIZATION  06/18/2016   Normal coronaries. Normal EF=60%. Dr. Welton FlakesKhan  . CYST EXCISION  2001   wrist  . wrist surgery  2001   Family History  Problem Relation Age of Onset  . Hypertension Mother   . Cancer Mother        ovarian  . Ovarian cancer Mother   . Hypertension Father   . Diabetes Father   . Kidney disease Father   . Prostate cancer Father   . Heart failure Father   . Diabetes Sister   . Hypertension Sister   . Hypertension Brother   . Stroke Brother   . Hypertension Sister   . Pancreatic cancer Brother   . Hypertension Brother   . Breast cancer Neg Hx   . Colon cancer Neg Hx    Outpatient Encounter Medications as of 06/05/2019  Medication Sig  . COMBIPATCH 0.05-0.14 MG/DAY PLACE 1 PATCH ONTO THE SKIN 2 (TWO)  TIMES A WEEK.  . fluticasone (FLONASE) 50 MCG/ACT nasal spray SPRAY 2 SPRAYS INTO EACH NOSTRIL EVERY DAY  . triamterene-hydrochlorothiazide (MAXZIDE-25) 37.5-25 MG tablet Take 0.5 tablets by mouth daily.  . vitamin C (ASCORBIC ACID) 500 MG tablet Take 500 mg by mouth daily.   No facility-administered encounter medications on file as of 06/05/2019.    Allergies  Allergen Reactions  . Metoprolol Swelling   Review of Systems  Skin: Positive for rash.    Objective:  BP (!) 156/82 (BP Location: Right Arm, Patient Position: Sitting, Cuff Size: Normal)   Pulse 82   Temp 98.9 F (37.2 C) (Oral)   Wt 181 lb (82.1 kg)   SpO2 98%   BMI 30.12 kg/m   Physical Exam  Constitutional: She is oriented to person, place, and time and well-developed, well-nourished, and in no distress.  HENT:  Head: Normocephalic.  Eyes: Conjunctivae are normal.  Neck: Neck supple.  Pulmonary/Chest: Effort normal.  Abdominal: Soft.  Musculoskeletal: Normal range  of motion.  Neurological: She is alert and oriented to person, place, and time.  Skin: Rash noted.  Pruritic rash on both hips, upper inner thighs and lower back. Small scattered (<0.5 cm) spots on arms and lower legs.  Psychiatric: Mood, affect and judgment normal.    Assessment and Plan :  1. Rhus dermatitis Rash developed over the past 4-5 days after working in the yard (spreading some mulch, also). May use Benadryl prn and given Prednisone taper over 6 days. Recheck prn. - predniSONE (DELTASONE) 5 MG tablet; Taper down by one tablet by mouth daily starting at 6 tablets (6,5,4,3,2,1)  Dispense: 21 tablet; Refill: 0

## 2019-07-17 ENCOUNTER — Emergency Department: Payer: BC Managed Care – PPO

## 2019-07-17 ENCOUNTER — Other Ambulatory Visit: Payer: Self-pay

## 2019-07-17 ENCOUNTER — Emergency Department
Admission: EM | Admit: 2019-07-17 | Discharge: 2019-07-17 | Disposition: A | Discharge status: Home / Self Care | Payer: BC Managed Care – PPO | Attending: Emergency Medicine | Admitting: Emergency Medicine

## 2019-07-17 ENCOUNTER — Encounter: Payer: Self-pay | Admitting: Emergency Medicine

## 2019-07-17 DIAGNOSIS — Z79899 Other long term (current) drug therapy: Secondary | ICD-10-CM | POA: Insufficient documentation

## 2019-07-17 DIAGNOSIS — R079 Chest pain, unspecified: Secondary | ICD-10-CM | POA: Diagnosis not present

## 2019-07-17 DIAGNOSIS — I10 Essential (primary) hypertension: Secondary | ICD-10-CM | POA: Diagnosis not present

## 2019-07-17 DIAGNOSIS — R0789 Other chest pain: Secondary | ICD-10-CM | POA: Diagnosis not present

## 2019-07-17 LAB — CBC WITH DIFFERENTIAL/PLATELET
Abs Immature Granulocytes: 0.01 10*3/uL (ref 0.00–0.07)
Basophils Absolute: 0 10*3/uL (ref 0.0–0.1)
Basophils Relative: 0 %
Eosinophils Absolute: 0.2 10*3/uL (ref 0.0–0.5)
Eosinophils Relative: 3 %
HCT: 36.8 % (ref 36.0–46.0)
Hemoglobin: 12.5 g/dL (ref 12.0–15.0)
Immature Granulocytes: 0 %
Lymphocytes Relative: 43 %
Lymphs Abs: 2.7 10*3/uL (ref 0.7–4.0)
MCH: 31.8 pg (ref 26.0–34.0)
MCHC: 34 g/dL (ref 30.0–36.0)
MCV: 93.6 fL (ref 80.0–100.0)
Monocytes Absolute: 0.6 10*3/uL (ref 0.1–1.0)
Monocytes Relative: 10 %
Neutro Abs: 2.9 10*3/uL (ref 1.7–7.7)
Neutrophils Relative %: 44 %
Platelets: 210 10*3/uL (ref 150–400)
RBC: 3.93 MIL/uL (ref 3.87–5.11)
RDW: 11.8 % (ref 11.5–15.5)
WBC: 6.4 10*3/uL (ref 4.0–10.5)
nRBC: 0 % (ref 0.0–0.2)

## 2019-07-17 LAB — COMPREHENSIVE METABOLIC PANEL
ALT: 18 U/L (ref 0–44)
AST: 24 U/L (ref 15–41)
Albumin: 3.9 g/dL (ref 3.5–5.0)
Alkaline Phosphatase: 70 U/L (ref 38–126)
Anion gap: 10 (ref 5–15)
BUN: 15 mg/dL (ref 6–20)
CO2: 24 mmol/L (ref 22–32)
Calcium: 9.3 mg/dL (ref 8.9–10.3)
Chloride: 107 mmol/L (ref 98–111)
Creatinine, Ser: 1.08 mg/dL — ABNORMAL HIGH (ref 0.44–1.00)
GFR calc Af Amer: >60 mL/min (ref >60)
GFR calc non Af Amer: 57 mL/min — ABNORMAL LOW (ref >60)
Glucose, Bld: 105 mg/dL — ABNORMAL HIGH (ref 70–99)
Potassium: 3.3 mmol/L — ABNORMAL LOW (ref 3.5–5.1)
Sodium: 141 mmol/L (ref 135–145)
Total Bilirubin: 0.5 mg/dL (ref 0.3–1.2)
Total Protein: 7.7 g/dL (ref 6.5–8.1)

## 2019-07-17 LAB — TROPONIN I (HIGH SENSITIVITY)
Troponin I (High Sensitivity): 4 ng/L (ref <18)
Troponin I (High Sensitivity): 3 ng/L (ref <18)

## 2019-07-17 NOTE — Discharge Instructions (Signed)

## 2019-07-17 NOTE — ED Triage Notes (Signed)
Pt ambulatory to triage with no difficulty. Pt reports awakened from sleep this morning around 445 with pain in her central chest that felt like indigestion.

## 2019-07-17 NOTE — ED Provider Notes (Signed)
[facility] Emergency Department Provider Note  ____________________________________________  Time seen: Approximately 6:20 AM  I have reviewed the triage vital signs and the nursing notes.   HISTORY  Chief Complaint Chest Pain   HPI Caroline Bowen is a 58 y.o. female with history of anemia and hypertension who presents for evaluation of chest pain.  Patient reports being in her usual state of health when she woke up this morning.  She was laying in bed waiting for her alarm clock to go off.  She reports that she felt a burning sensation coming up her chest all the way to her throat.  Briefly she felt short of breath briefly .  She sat up on the side of the bed, SOB resolved.  The burning sensation then went down her chest.  After that she developed some pressure in her chest which felt like she had air trapped and if she burped it would go away.  She try to drink some water which helped her symptoms.  At this time she is chest pain-free.  She denies shortness of breath, diaphoresis, nausea, vomiting, dizziness, abdominal pain.  No personal or family history of heart attacks, PE or DVT.  No history of GERD.  Patient denies alcohol use.  She is not a smoker.  She denies ever having similar symptoms.   Past Medical History:  Diagnosis Date   Anemia    Hypertension     Patient Active Problem List   Diagnosis Date Noted   Hyperlipidemia 05/29/2019   Menopausal disorder 06/05/2015   Absolute anemia 05/24/2015   Accumulation of fluid in tissues 05/24/2015   Abnormal finding on breast imaging 04/16/2010   Essential hypertension 09/06/2008   Headache disorder 09/06/2008    Past Surgical History:  Procedure Laterality Date   CARDIAC CATHETERIZATION  06/18/2016   Normal coronaries. Normal EF=60%. Dr. Humphrey Rolls   CYST EXCISION  2001   wrist   wrist surgery  2001    Prior to Admission medications   Medication Sig Start Date End Date Taking? Authorizing  Provider  COMBIPATCH 0.05-0.14 MG/DAY PLACE 1 PATCH ONTO THE SKIN 2 (TWO) TIMES A WEEK. 12/26/18   Carmon Ginsberg, PA  fluticasone [facility]) 50 MCG/ACT nasal spray SPRAY 2 SPRAYS INTO EACH NOSTRIL EVERY DAY 09/20/18   Bacigalupo, Dionne Bucy, MD  predniSONE (DELTASONE) 5 MG tablet Taper down by one tablet by mouth daily starting at 6 tablets (6,5,4,3,2,1) 06/05/19   Chrismon, Vickki Muff, PA  triamterene-hydrochlorothiazide (MAXZIDE-25) 37.5-25 MG tablet Take 0.5 tablets by mouth daily. 11/28/18   Bacigalupo, Dionne Bucy, MD  vitamin C (ASCORBIC ACID) 500 MG tablet Take 500 mg by mouth daily.    [provider]    Allergies Metoprolol  Family History  Problem Relation Age of Onset   Hypertension Mother    Cancer Mother        ovarian   Ovarian cancer Mother    Hypertension Father    Diabetes Father    Kidney disease Father    Prostate cancer Father    Heart failure Father    Diabetes Sister    Hypertension Sister    Hypertension Brother    Stroke Brother    Hypertension Sister    Pancreatic cancer Brother    Hypertension Brother    Breast cancer Neg Hx    Colon cancer Neg Hx     Social History Social History   Tobacco Use   Smoking status: Never Smoker   Smokeless  tobacco: Never Used  Substance Use Topics   Alcohol use: No    Comment: very rare   Drug use: No    Review of Systems  Constitutional: Negative for fever. Eyes: Negative for visual changes. ENT: Negative for sore throat. Neck: No neck pain  Cardiovascular: + chest pain. Respiratory: Negative for shortness of breath. Gastrointestinal: Negative for abdominal pain, vomiting or diarrhea. Genitourinary: Negative for dysuria. Musculoskeletal: Negative for back pain. Skin: Negative for rash. Neurological: Negative for headaches, weakness or numbness. Psych: No SI or HI  ____________________________________________   PHYSICAL EXAM:  VITAL SIGNS: ED Triage Vitals  Enc Vitals Group       BP 07/17/19 0557 (!) 160/101     Pulse Rate 07/17/19 0557 74     Resp 07/17/19 0557 18     Temp 07/17/19 0557 98.2 F (36.8 C)     Temp Source 07/17/19 0557 Oral     SpO2 07/17/19 0557 96 %     Weight 07/17/19 0553 175 lb (79.4 kg)     Height 07/17/19 0553 5\' 6"  (1.676 m)     Head Circumference --      Peak Flow --      Pain Score 07/17/19 0553 1     Pain Loc --      Pain Edu? --      Excl. in GC? --     Constitutional: Alert and oriented. Well appearing and in no apparent distress. HEENT:      Head: Normocephalic and atraumatic.         Eyes: Conjunctivae are normal. Sclera is non-icteric.       Mouth/Throat: Mucous membranes are moist.       Neck: Supple with no signs of meningismus. Cardiovascular: Regular rate and rhythm. No murmurs, gallops, or rubs. 2+ symmetrical distal pulses are present in all extremities. No JVD. Respiratory: Normal respiratory effort. Lungs are clear to auscultation bilaterally. No wheezes, crackles, or rhonchi.  Gastrointestinal: Soft, non tender, and non distended with positive bowel sounds. No rebound or guarding. Musculoskeletal: Nontender with normal range of motion in all extremities. No edema, cyanosis, or erythema of extremities. Neurologic: Normal speech and language. Face is symmetric. Moving all extremities. No gross focal neurologic deficits are appreciated. Skin: Skin is warm, dry and intact. No rash noted. Psychiatric: Mood and affect are normal. Speech and behavior are normal.  ____________________________________________   LABS (all labs ordered are listed, but only abnormal results are displayed)  Labs Reviewed  COMPREHENSIVE METABOLIC PANEL - Abnormal; Notable for the following components:      Result Value   Potassium 3.3 (*)    Glucose, Bld 105 (*)    Creatinine, Ser 1.08 (*)    GFR calc non Af Amer 57 (*)    All other components within normal limits  CBC WITH DIFFERENTIAL/PLATELET  TROPONIN I (HIGH SENSITIVITY)    ____________________________________________  EKG  ED ECG REPORT I, Nita Sicklearolina Kimari Lienhard, the attending physician, personally viewed and interpreted this ECG.  Normal sinus rhythm, rate of 69, normal intervals, normal axis, T wave inversions in inferior lateral leads.  Unchanged from prior. ____________________________________________  RADIOLOGY  I have personally reviewed the images performed during this visit and I agree with the Radiologist's read.   Interpretation by Radiologist:  Dg Chest Portable 1 View  Result Date: 07/17/2019 CLINICAL DATA:  Chest pain. EXAM: PORTABLE CHEST 1 VIEW COMPARISON:  06/16/2014. FINDINGS: Mediastinum and hilar structures normal. Heart size normal. Low lung volumes with mild bibasilar  atelectasis. No pleural effusion or pneumothorax. Thoracic spine scoliosis. No acute bony abnormality. IMPRESSION: Low lung volumes with mild bibasilar atelectasis. Electronically Signed   By: Maisie Fushomas  Register   On: 07/17/2019 06:22      ____________________________________________   PROCEDURES  Procedure(s) performed: None Procedures Critical Care performed:  None ____________________________________________   INITIAL IMPRESSION / ASSESSMENT AND PLAN / ED COURSE  58 y.o. female with history of anemia and hypertension who presents for evaluation of burning chest pain while laying in bed which has now resolved.  Patient does have an abnormal EKG with significant T wave inversions in inferior and lateral leads however that is present on prior EKG from 2015.  At that time patient presented with similar symptoms, was admitted to the hospital and underwent a heart catheterization which was unremarkable.  She has no risk factors for coronary artery disease.  She is pain-free at this time.  Will further stratify patient with troponin x2.  We will do a chest x-ray.  Check labs.  Monitor for any recurrence of symptoms.  If evaluation is negative in the emergency room will  refer patient to cardiology for outpatient follow-up.  Differential diagnosis including GERD versus gastritis versus peptic ulcer disease versus ACS.  Clinical Course as of Jul 17 651  [facility]  [id number] Labs within baseline. Remains asymptomatic. First troponin is negative. 2nd trop at [id number]. Care transferred to Dr. Lenard LancePaduchowski.   [CV]    Clinical Course User Index [CV] Don PerkingVeronese, [place], MD        As part of my medical decision making, I reviewed the following data within the electronic MEDICAL RECORD NUMBER Nursing notes reviewed and incorporated, Labs reviewed , EKG interpreted , Old EKG reviewed, Old chart reviewed, Radiograph reviewed , Notes from prior ED visits and [place] Controlled Substance Database   Patient was evaluated in Emergency Department today for the symptoms described in the history of present illness. Patient was evaluated in the context of the global COVID-19 pandemic, which necessitated consideration that the patient might be at risk for infection with the SARS-CoV-2 virus that causes COVID-19. Institutional protocols and algorithms that pertain to the evaluation of patients at risk for COVID-19 are in a state of rapid change based on information released by regulatory bodies including the CDC and federal and state organizations. These policies and algorithms were followed during the patient's care in the ED.   ____________________________________________   FINAL CLINICAL IMPRESSION(S) / ED DIAGNOSES   Final diagnoses:  Chest pain, unspecified type      NEW MEDICATIONS STARTED DURING THIS VISIT:  ED Discharge Orders    None       Note:  This document was prepared using Dragon voice recognition software and may include unintentional dictation errors.    Don PerkingVeronese, [place], MD 07/17/19 [phone]

## 2019-07-17 NOTE — ED Provider Notes (Signed)
-----------------------------------------   8:58 AM on 07/17/2019 -----------------------------------------  Patient care assumed from Dr. Alfred Levins.  Patient continues to appear well, continues to be chest pain-free.  Repeat troponin remains negative.  At this time given the patient is reassuring exam and being symptom-free I believe she would be safe for discharge home with PCP follow-up.  I discussed this with the patient she is agreeable to this plan of care.  Provided my normal chest pain return precautions.   Harvest Dark, MD 07/17/19 [phone]

## 2019-07-18 ENCOUNTER — Telehealth: Payer: Self-pay

## 2019-07-18 NOTE — Telephone Encounter (Signed)
Patient states that she went to pharmacy to pick up her Combipatch and it needs a prior authorization because she switched insurances. Patient states that she is out, and cannot go without this medication. Please advise.

## 2019-07-19 NOTE — Telephone Encounter (Signed)
I haven't seen a prior authorization yet, have you? We can try to get this done today

## 2019-07-19 NOTE — Telephone Encounter (Signed)
Caroline Bowen with [employer] is requesting call back to discuss missing information on the PA they received. Please advise. Thanks TNP

## 2019-07-19 NOTE — Telephone Encounter (Signed)
Spoke with [place] with [employer] to answer questions. She is sending over for review. Patient advised.

## 2019-07-19 NOTE — Telephone Encounter (Signed)
Attempted to contact Sharita with [employer], no answer or voicemail.

## 2019-07-21 DIAGNOSIS — I10 Essential (primary) hypertension: Secondary | ICD-10-CM | POA: Diagnosis not present

## 2019-07-21 DIAGNOSIS — E785 Hyperlipidemia, unspecified: Secondary | ICD-10-CM | POA: Diagnosis not present

## 2019-07-21 DIAGNOSIS — R9431 Abnormal electrocardiogram [ECG] [EKG]: Secondary | ICD-10-CM | POA: Diagnosis not present

## 2019-07-21 DIAGNOSIS — R079 Chest pain, unspecified: Secondary | ICD-10-CM | POA: Diagnosis not present

## 2019-07-24 ENCOUNTER — Other Ambulatory Visit: Payer: Self-pay | Admitting: Family Medicine

## 2019-07-24 MED ORDER — ESTRADIOL 0.05 MG/24HR TD PTWK: 0.0500 mg | MEDICATED_PATCH | TRANSDERMAL | 12 refills | Status: DC

## 2019-07-24 MED ORDER — NORETHINDRONE 0.35 MG PO TABS: 1.0000 | ORAL_TABLET | Freq: Every day | ORAL | 3 refills | Status: DC

## 2019-07-24 NOTE — Progress Notes (Signed)
Combipatch declined by insurance. No longer on formulary.  Will switch patient to estradiol weekly patch.  She does still have a uterus and therefore still needs progesterone therapy as well, so will give norethindrone tablet daily.  This will maintain same hormones she is currently using.  She has no contraindications to estrogen therapy.  Combipatch (which she has been taking for many years) has been helpful in reducing menopausal symptoms.  Bacigalupo, Dionne Bucy, MD, MPH [facility]

## 2019-07-27 DIAGNOSIS — R9431 Abnormal electrocardiogram [ECG] [EKG]: Secondary | ICD-10-CM | POA: Diagnosis not present

## 2019-07-27 DIAGNOSIS — R079 Chest pain, unspecified: Secondary | ICD-10-CM | POA: Diagnosis not present

## 2019-11-30 ENCOUNTER — Encounter: Payer: BLUE CROSS/BLUE SHIELD | Admitting: Family Medicine

## 2019-12-01 ENCOUNTER — Encounter: Payer: Self-pay | Admitting: Family Medicine

## 2019-12-04 ENCOUNTER — Ambulatory Visit (INDEPENDENT_AMBULATORY_CARE_PROVIDER_SITE_OTHER): Payer: BC Managed Care – PPO | Admitting: Family Medicine

## 2019-12-04 ENCOUNTER — Encounter: Payer: Self-pay | Admitting: Family Medicine

## 2019-12-04 ENCOUNTER — Other Ambulatory Visit: Payer: Self-pay

## 2019-12-04 VITALS — BP 125/75 | HR 60 | Temp 96.6°F | Wt 174.0 lb

## 2019-12-04 DIAGNOSIS — Z23 Encounter for immunization: Secondary | ICD-10-CM

## 2019-12-04 DIAGNOSIS — I10 Essential (primary) hypertension: Secondary | ICD-10-CM | POA: Diagnosis not present

## 2019-12-04 DIAGNOSIS — Z862 Personal history of diseases of the blood and blood-forming organs and certain disorders involving the immune mechanism: Secondary | ICD-10-CM

## 2019-12-04 DIAGNOSIS — Z0000 Encounter for general adult medical examination without abnormal findings: Secondary | ICD-10-CM

## 2019-12-04 DIAGNOSIS — Z1231 Encounter for screening mammogram for malignant neoplasm of breast: Secondary | ICD-10-CM | POA: Diagnosis not present

## 2019-12-04 DIAGNOSIS — E782 Mixed hyperlipidemia: Secondary | ICD-10-CM | POA: Diagnosis not present

## 2019-12-04 DIAGNOSIS — E663 Overweight: Secondary | ICD-10-CM

## 2019-12-04 NOTE — Patient Instructions (Signed)

## 2019-12-04 NOTE — Assessment & Plan Note (Signed)
Recheck CBC. 

## 2019-12-04 NOTE — Assessment & Plan Note (Signed)
Congratulated her on her weight loss Discussed importance of healthy weight management Discussed diet and exercise

## 2019-12-04 NOTE — Progress Notes (Signed)
Patient: Caroline Bowen, Female    DOB: [birth year], 59 y.o.   MRN: [id number] Visit Date: 12/04/2019  Today's Provider: Lavon Paganini, MD   Chief Complaint  Patient presents with  . Annual Exam   Subjective:     Annual physical exam Caroline Bowen is a 59 y.o. female who presents today for health maintenance and complete physical. She feels well. She reports exercising regularly-walking and working with a Physiological scientist. She reports she is sleeping well.  -----------------------------------------------------------------   Review of Systems  Constitutional: Negative.   HENT: Negative.   Eyes: Negative.   Respiratory: Negative.   Cardiovascular: Negative.   Gastrointestinal: Negative.   Endocrine: Negative.   Genitourinary: Negative.   Musculoskeletal: Negative.   Skin: Negative.   Allergic/Immunologic: Negative.   Neurological: Negative.   Hematological: Negative.   Psychiatric/Behavioral: Negative.     Social History      She  reports that she has never smoked. She has never used smokeless tobacco. She reports that she does not drink alcohol or use drugs.       Social History   Socioeconomic History  . Marital status: Married    Spouse name: Cristie Hem  . Number of children: 1  . Years of education: 84  . Highest education level: Bachelor's degree (e.g., BA, AB, BS)  Occupational History  . Not on file  Tobacco Use  . Smoking status: Never Smoker  . Smokeless tobacco: Never Used  Substance and Sexual Activity  . Alcohol use: No    Comment: very rare  . Drug use: No  . Sexual activity: Yes    Birth control/protection: Post-menopausal  Other Topics Concern  . Not on file  Social History Narrative  . Not on file   Social Determinants of Health   Financial Resource Strain:   . Difficulty of Paying Living Expenses: Not on file  Food Insecurity:   . Worried About Charity fundraiser in the Last Year: Not on file  . Ran Out of Food in the Last Year:  Not on file  Transportation Needs:   . Lack of Transportation (Medical): Not on file  . Lack of Transportation (Non-Medical): Not on file  Physical Activity:   . Days of Exercise per Week: Not on file  . Minutes of Exercise per Session: Not on file  Stress:   . Feeling of Stress : Not on file  Social Connections:   . Frequency of Communication with Friends and Family: Not on file  . Frequency of Social Gatherings with Friends and Family: Not on file  . Attends Religious Services: Not on file  . Active Member of Clubs or Organizations: Not on file  . Attends Archivist Meetings: Not on file  . Marital Status: Not on file    Past Medical History:  Diagnosis Date  . Anemia   . Hypertension      Patient Active Problem List   Diagnosis Date Noted  . Overweight 12/04/2019  . Hyperlipidemia 05/29/2019  . Menopausal disorder 06/05/2015  . History of anemia 05/24/2015  . Accumulation of fluid in tissues 05/24/2015  . Abnormal finding on breast imaging 04/16/2010  . Essential hypertension 09/06/2008  . Headache disorder 09/06/2008    Past Surgical History:  Procedure Laterality Date  . CARDIAC CATHETERIZATION  06/18/2016   Normal coronaries. Normal EF=60%. Dr. Humphrey Rolls  . CYST EXCISION  2001   wrist  . wrist surgery  2001    Family History  Family Status  Relation Name Status  . Mother  Alive  . Father  Deceased  . Sister  Alive  . Brother  Alive  . Sister  Alive  . Brother  Deceased  . Brother  Alive  . Neg Hx  (Not Specified)        Her family history includes Cancer in her mother; Diabetes in her father and sister; Heart failure in her father; Hypertension in her brother, brother, father, mother, sister, and sister; Kidney disease in her father; Ovarian cancer in her mother; Pancreatic cancer in her brother; Prostate cancer in her father; Stroke in her brother. There is no history of Breast cancer or Colon cancer.      Allergies  Allergen Reactions   . Metoprolol Swelling     Current Outpatient Medications:  .  estradiol (CLIMARA - DOSED IN MG/24 HR) 0.05 mg/24hr patch, Place 1 patch (0.05 mg total) onto the skin once a week., Disp: 4 patch, Rfl: 12 .  fluticasone (FLONASE) 50 MCG/ACT nasal spray, SPRAY 2 SPRAYS INTO EACH NOSTRIL EVERY DAY, Disp: 16 g, Rfl: 6 .  triamterene-hydrochlorothiazide (MAXZIDE-25) 37.5-25 MG tablet, Take 0.5 tablets by mouth daily., Disp: 45 tablet, Rfl: 3 .  vitamin C (ASCORBIC ACID) 500 MG tablet, Take 500 mg by mouth daily., Disp: , Rfl:    Patient Care Team: Erasmo Downer, MD as PCP - General (Family Medicine)    Objective:    Vitals: BP 125/75   Pulse 60   Temp (!) 96.6 F (35.9 C) (Temporal)   Wt 174 lb (78.9 kg)   BMI 28.08 kg/m    Vitals:   12/04/19 1014 12/04/19 1030 12/04/19 1052  BP: (!) 152/94 (!) 142/86 125/75  Pulse: 60    Temp: (!) 96.6 F (35.9 C)    TempSrc: Temporal    Weight: 174 lb (78.9 kg)       Physical Exam Vitals reviewed.  Constitutional:      General: She is not in acute distress.    Appearance: Normal appearance. She is well-developed. She is not diaphoretic.  HENT:     Head: Normocephalic and atraumatic.     Right Ear: Tympanic membrane, ear canal and external ear normal.     Left Ear: Tympanic membrane, ear canal and external ear normal.  Eyes:     General: No scleral icterus.    Conjunctiva/sclera: Conjunctivae normal.     Pupils: Pupils are equal, round, and reactive to light.  Neck:     Thyroid: No thyromegaly.  Cardiovascular:     Rate and Rhythm: Normal rate and regular rhythm.     Heart sounds: Normal heart sounds. No murmur.  Pulmonary:     Effort: Pulmonary effort is normal. No respiratory distress.     Breath sounds: Normal breath sounds. No wheezing or rales.  Abdominal:     General: There is no distension.     Palpations: Abdomen is soft.     Tenderness: There is no abdominal tenderness. There is no guarding or rebound.   Genitourinary:    Comments: Breasts: breasts appear normal, no suspicious masses, no skin or nipple changes or axillary nodes.  Musculoskeletal:        General: No deformity.     Cervical back: Neck supple.     Right lower leg: No edema.     Left lower leg: No edema.  Lymphadenopathy:     Cervical: No cervical adenopathy.  Skin:    General: Skin is warm  and dry.     Capillary Refill: Capillary refill takes less than 2 seconds.     Findings: No rash.  Neurological:     Mental Status: She is alert and oriented to person, place, and time. Mental status is at baseline.     Gait: Gait normal.  Psychiatric:        Mood and Affect: Mood normal.        Behavior: Behavior normal.        Thought Content: Thought content normal.      Depression Screen PHQ 2/9 Scores 12/04/2019 11/28/2018 11/26/2017 09/09/2015  PHQ - 2 Score 0 0 1 0  PHQ- 9 Score 0 0 - -       Assessment & Plan:     Routine Health Maintenance and Physical Exam  Exercise Activities and Dietary recommendations Goals    . Exercise 150 minutes per week (moderate activity)       Immunization History  Administered Date(s) Administered  . Influenza,inj,Quad PF,6+ Mos 09/29/2013, 09/09/2015, 11/19/2016, 08/27/2017, 08/26/2018, 12/04/2019  . Tdap 11/26/2017    Health Maintenance  Topic Date Due  . MAMMOGRAM  01/05/2021  . PAP SMEAR-Modifier  11/19/2021  . COLONOSCOPY  12/30/2022  . TETANUS/TDAP  11/27/2027  . INFLUENZA VACCINE  Completed  . Hepatitis C Screening  Completed  . HIV Screening  Completed     Discussed health benefits of physical activity, and encouraged her to engage in regular exercise appropriate for her age and condition.    --------------------------------------------------------------------  Problem List Items Addressed This Visit      Cardiovascular and Mediastinum   Essential hypertension    Well controlled Continue current medications Recheck metabolic panel F/u in 6 months        Relevant Orders   CMP (Comprehensive metabolic panel)     Other   History of anemia    Recheck CBC      Relevant Orders   CBC   Hyperlipidemia    Previously elevated, but not on a statin Repeat FLP and calculate ASCVD risk Discussed diet and exercise      Relevant Orders   CMP (Comprehensive metabolic panel)   Lipid panel   Overweight    Congratulated her on her weight loss Discussed importance of healthy weight management Discussed diet and exercise       Other Visit Diagnoses    Encounter for annual physical exam    -  Primary   Relevant Orders   CMP (Comprehensive metabolic panel)   Lipid panel   MM 3D SCREEN BREAST BILATERAL   CBC   Need for influenza vaccination       Relevant Orders   Flu Vaccine QUAD 6+ mos PF IM (Fluarix Quad PF) (Completed)   Encounter for screening mammogram for malignant neoplasm of breast       Relevant Orders   MM 3D SCREEN BREAST BILATERAL       Return in about 6 months (around 06/02/2020) for chronic disease f/u.   The entirety of the information documented in the History of Present Illness, Review of Systems and Physical Exam were personally obtained by me. Portions of this information were initially documented by Kavin Leech, CMA and reviewed by me for thoroughness and accuracy.    Zoraida Havrilla, Marzella Schlein, MD MPH [facility]

## 2019-12-04 NOTE — Assessment & Plan Note (Signed)
Well controlled Continue current medications Recheck metabolic panel F/u in 6 months  

## 2019-12-04 NOTE — Assessment & Plan Note (Signed)
Previously elevated, but not on a statin Repeat FLP and calculate ASCVD risk Discussed diet and exercise

## 2019-12-05 ENCOUNTER — Telehealth: Payer: Self-pay

## 2019-12-05 LAB — CBC
Hematocrit: 38.5 % (ref 34.0–46.6)
Hemoglobin: 12.8 g/dL (ref 11.1–15.9)
MCH: 31.5 pg (ref 26.6–33.0)
MCHC: 33.2 g/dL (ref 31.5–35.7)
MCV: 95 fL (ref 79–97)
Platelets: 213 10*3/uL (ref 150–450)
RBC: 4.06 x10E6/uL (ref 3.77–5.28)
RDW: 12.2 % (ref 11.7–15.4)
WBC: 5.8 10*3/uL (ref 3.4–10.8)

## 2019-12-05 LAB — LIPID PANEL
Chol/HDL Ratio: 3.3 ratio (ref 0.0–4.4)
Cholesterol, Total: 264 mg/dL — ABNORMAL HIGH (ref 100–199)
HDL: 80 mg/dL (ref >39)
LDL Chol Calc (NIH): 160 mg/dL — ABNORMAL HIGH (ref 0–99)
Triglycerides: 137 mg/dL (ref 0–149)
VLDL Cholesterol Cal: 24 mg/dL (ref 5–40)

## 2019-12-05 LAB — COMPREHENSIVE METABOLIC PANEL
ALT: 15 IU/L (ref 0–32)
AST: 20 IU/L (ref 0–40)
Albumin/Globulin Ratio: 1.6 (ref 1.2–2.2)
Albumin: 4.6 g/dL (ref 3.8–4.9)
Alkaline Phosphatase: 83 IU/L (ref 39–117)
BUN/Creatinine Ratio: 10 (ref 9–23)
BUN: 10 mg/dL (ref 6–24)
Bilirubin Total: 0.3 mg/dL (ref 0.0–1.2)
CO2: 25 mmol/L (ref 20–29)
Calcium: 9.9 mg/dL (ref 8.7–10.2)
Chloride: 101 mmol/L (ref 96–106)
Creatinine, Ser: 1.03 mg/dL — ABNORMAL HIGH (ref 0.57–1.00)
GFR calc Af Amer: 69 mL/min/{1.73_m2} (ref >59)
GFR calc non Af Amer: 60 mL/min/{1.73_m2} (ref >59)
Globulin, Total: 2.8 g/dL (ref 1.5–4.5)
Glucose: 89 mg/dL (ref 65–99)
Potassium: 4 mmol/L (ref 3.5–5.2)
Sodium: 141 mmol/L (ref 134–144)
Total Protein: 7.4 g/dL (ref 6.0–8.5)

## 2019-12-05 NOTE — Telephone Encounter (Signed)
Patient notified of lab results and PCP recommendation. Patient states she is slightly disappointment because she has been working very hard to get numbers down. She exercises and watches diet. Praised patient for her hard work and told her it does show in her HDL numbers. She is going to take a closer look at diet and see if there is anything she can change there.

## 2019-12-05 NOTE — Telephone Encounter (Signed)
LMT[facility] may give results 

## 2019-12-05 NOTE — Telephone Encounter (Signed)
-----   Message from Erasmo Downer, MD sent at 12/05/2019  8:51 AM EST ----- Normal blood sugar, electrolytes, liver function, blood counts.  Stable kidney function.  Cholesterol is high, and has increased since last year, but 10-year risk of heart disease and stroke remains low at 4.8%.  No need for a statin medication at this time, but I do recommend diet low in saturated fat and regular exercise - 30 min at least 5 times per week.

## 2019-12-30 ENCOUNTER — Other Ambulatory Visit: Payer: Self-pay | Admitting: Family Medicine

## 2019-12-30 DIAGNOSIS — I10 Essential (primary) hypertension: Secondary | ICD-10-CM

## 2019-12-30 NOTE — Telephone Encounter (Signed)
Requested Prescriptions  Pending Prescriptions Disp Refills  . triamterene-hydrochlorothiazide (MAXZIDE-25) 37.5-25 MG tablet [Pharmacy Med Name: TRIAMTERENE-HCTZ 37.5-25 MG TB] 45 tablet 3    Sig: TAKE 1/2 TABLET BY MOUTH EVERY DAY     Cardiovascular: Diuretic Combos Failed - 12/30/2019  9:41 AM      Failed - Cr in normal range and within 360 days    Creatinine  Date Value Ref Range Status  06/17/2014 1.22 0.60 - 1.30 mg/dL Final   Creatinine, Ser  Date Value Ref Range Status  12/04/2019 1.03 (H) 0.57 - 1.00 mg/dL Final         Failed - Valid encounter within last 6 months    Recent Outpatient Visits          3 weeks ago Encounter for annual physical exam   [facility], Marzella Schlein, MD   6 months ago Rhus dermatitis   [facility], [place], [place]   7 months ago Essential hypertension   [facility], Marzella Schlein, MD   1 year ago Encounter for annual physical exam   [facility] [place], Marzella Schlein, MD   1 year ago Seasonal allergic rhinitis due to pollen   [facility], Alessandra Bevels, PA-C      Future Appointments            In 5 months Bacigalupo, Marzella Schlein, MD [facility], [facility]           Passed - K in normal range and within 360 days    Potassium  Date Value Ref Range Status  12/04/2019 4.0 3.5 - 5.2 mmol/L Final  06/17/2014 3.6 3.5 - 5.1 mmol/L Final         Passed - Na in normal range and within 360 days    Sodium  Date Value Ref Range Status  12/04/2019 141 134 - 144 mmol/L Final  06/17/2014 140 136 - 145 mmol/L Final         Passed - Ca in normal range and within 360 days    Calcium  Date Value Ref Range Status  12/04/2019 9.9 8.7 - 10.2 mg/dL Final   Calcium, Total  Date Value Ref Range Status  06/17/2014 8.8 8.5 - 10.1 mg/dL Final         Passed - Last BP in normal range    BP Readings from Last 1 Encounters:  12/04/19 125/75

## 2020-01-08 ENCOUNTER — Telehealth: Payer: Self-pay

## 2020-01-08 ENCOUNTER — Ambulatory Visit
Admission: RE | Admit: 2020-01-08 | Discharge: 2020-01-08 | Disposition: A | Discharge status: Home / Self Care | Payer: BC Managed Care – PPO | Source: Ambulatory Visit | Attending: Family Medicine | Admitting: Family Medicine

## 2020-01-08 DIAGNOSIS — Z1231 Encounter for screening mammogram for malignant neoplasm of breast: Secondary | ICD-10-CM | POA: Insufficient documentation

## 2020-01-08 DIAGNOSIS — Z0000 Encounter for general adult medical examination without abnormal findings: Secondary | ICD-10-CM | POA: Diagnosis not present

## 2020-01-08 NOTE — Telephone Encounter (Signed)
Pt advised.   Thanks,   -Mellody Masri  

## 2020-01-08 NOTE — Telephone Encounter (Signed)
-----   Message from Erasmo Downer, MD sent at 01/08/2020  1:33 PM EST ----- Normal mammogram. Repeat in 1 yr

## 2020-01-26 ENCOUNTER — Encounter: Payer: Self-pay | Admitting: Family Medicine

## 2020-01-26 ENCOUNTER — Ambulatory Visit: Payer: Self-pay | Admitting: *Deleted

## 2020-01-26 ENCOUNTER — Other Ambulatory Visit: Payer: Self-pay | Admitting: Family Medicine

## 2020-01-26 ENCOUNTER — Ambulatory Visit: Payer: BC Managed Care – PPO | Admitting: Family Medicine

## 2020-01-26 ENCOUNTER — Other Ambulatory Visit: Payer: Self-pay

## 2020-01-26 VITALS — BP 124/76 | HR 73 | Temp 96.9°F | Resp 16 | Ht 66.0 in | Wt 176.6 lb

## 2020-01-26 DIAGNOSIS — R194 Change in bowel habit: Secondary | ICD-10-CM | POA: Diagnosis not present

## 2020-01-26 DIAGNOSIS — K625 Hemorrhage of anus and rectum: Secondary | ICD-10-CM | POA: Diagnosis not present

## 2020-01-26 NOTE — Progress Notes (Signed)
Patient: Caroline Bowen Female    DOB: March 18, 1961   59 y.o.   MRN: [id number] Visit Date: 01/26/2020  Today's Provider: Lavon Paganini, MD   Chief Complaint  Patient presents with  . Rectal Bleeding   Subjective:    I [place] S. Dimas, CMA, am acting as scribe for Lavon Paganini, MD.   Rectal Bleeding  The current episode started 2 days ago. The onset is undetermined. The problem occurs continuously. The problem has been gradually worsening. The patient is experiencing no pain. The stool is described as soft. Associated symptoms include abdominal pain. Pertinent negatives include no fever, no diarrhea, no hemorrhoids, no rectal pain, no vomiting, no hematuria, no vaginal bleeding and no chest pain.  Patient reports she only has abdominal pain right before having bowel movement. Patient denies any constipation or diarrhea.  This is a change from baseline that she is having BM right after eating or last few months.  Worse yesterday with quarter sized spot on toilet paper.  Did not notice any in the toilet.  Last colonoscopy in 2014 was normal  Allergies  Allergen Reactions  . Metoprolol Swelling     Current Outpatient Medications:  .  estradiol (CLIMARA - DOSED IN MG/24 HR) 0.05 mg/24hr patch, Place 1 patch (0.05 mg total) onto the skin once a week., Disp: 4 patch, Rfl: 12 .  fluticasone (FLONASE) 50 MCG/ACT nasal spray, SPRAY 2 SPRAYS INTO EACH NOSTRIL EVERY DAY, Disp: 16 mL, Rfl: 6 .  triamterene-hydrochlorothiazide (MAXZIDE-25) 37.5-25 MG tablet, TAKE 1/2 TABLET BY MOUTH EVERY DAY, Disp: 45 tablet, Rfl: 3 .  vitamin C (ASCORBIC ACID) 500 MG tablet, Take 500 mg by mouth daily., Disp: , Rfl:   Review of Systems  Constitutional: Negative for fever and unexpected weight change.  Respiratory: Negative for shortness of breath.   Cardiovascular: Negative for chest pain and palpitations.  Gastrointestinal: Positive for abdominal pain, blood in stool and hematochezia.  Negative for diarrhea, hemorrhoids, rectal pain and vomiting.  Genitourinary: Negative for hematuria and vaginal bleeding.    Social History   Tobacco Use  . Smoking status: Never Smoker  . Smokeless tobacco: Never Used  Substance Use Topics  . Alcohol use: No    Comment: very rare      Objective:   BP 124/76 (BP Location: Left Arm, Patient Position: Sitting, Cuff Size: Normal)   Pulse 73   Temp (!) 96.9 F (36.1 C) (Temporal)   Resp 16   Ht 5\' 6"  (1.676 m)   Wt 176 lb 9.6 oz (80.1 kg)   BMI 28.50 kg/m  Vitals:   01/26/20 1415  BP: 124/76  Pulse: 73  Resp: 16  Temp: (!) 96.9 F (36.1 C)  TempSrc: Temporal  Weight: 176 lb 9.6 oz (80.1 kg)  Height: 5\' 6"  (1.676 m)  Body mass index is 28.5 kg/m.   Physical Exam Vitals reviewed.  Constitutional:      General: She is not in acute distress.    Appearance: Normal appearance. She is well-developed. She is not diaphoretic.  HENT:     Head: Normocephalic and atraumatic.  Eyes:     General: No scleral icterus.    Conjunctiva/sclera: Conjunctivae normal.  Neck:     Thyroid: No thyromegaly.  Cardiovascular:     Rate and Rhythm: Normal rate and regular rhythm.     Pulses: Normal pulses.     Heart sounds: Normal heart sounds. No murmur.  Pulmonary:     Effort:  Pulmonary effort is normal. No respiratory distress.     Breath sounds: Normal breath sounds. No wheezing, rhonchi or rales.  Abdominal:     General: Bowel sounds are normal. There is no distension.     Palpations: Abdomen is soft.     Tenderness: There is no abdominal tenderness. There is no guarding or rebound.  Genitourinary:    Comments: Rectal exam: 1 external hemorrhoid, not inflamed. Normal rectal tone. No stool in rectal vault. Musculoskeletal:     Cervical back: Neck supple.     Right lower leg: No edema.     Left lower leg: No edema.  Lymphadenopathy:     Cervical: No cervical adenopathy.  Skin:    General: Skin is warm and dry.     Capillary  Refill: Capillary refill takes less than 2 seconds.     Findings: No rash.  Neurological:     Mental Status: She is alert and oriented to person, place, and time. Mental status is at baseline.  Psychiatric:        Mood and Affect: Mood normal.        Behavior: Behavior normal.      No results found for any visits on 01/26/20.     Assessment & Plan    1. Rectal bleeding 2. Change in bowel habits -New problem over the last several months -She does have an external hemorrhoid, which could be contributing to the rectal bleeding -Rectal exam was benign today otherwise -As there was no stool in the rectal vault, unable to perform Hemoccult today -Given the change in her bowel habits, with new loose stools after eating, do recommend follow-up with GI and may possibly need a colonoscopy -Recent CBC showed no anemia -Discussed strict return/emergent precautions - Ambulatory referral to Gastroenterology    Return if symptoms worsen or fail to improve.   The entirety of the information documented in the History of Present Illness, Review of Systems and Physical Exam were personally obtained by me. Portions of this information were initially documented by Rondel Baton, CMA and reviewed by me for thoroughness and accuracy.    Olivia Pavelko, Marzella Schlein, MD MPH [facility]

## 2020-01-26 NOTE — Patient Instructions (Signed)
Rectal Bleeding  Rectal bleeding is when blood passes out of the anus. People with rectal bleeding may notice bright red blood in their underwear or in the toilet after having a bowel movement. They may also have dark red or black stools. Rectal bleeding is usually a sign that something is wrong. Many things can cause rectal bleeding, including:  Hemorrhoids. These are blood vessels in the anus or rectum that are larger than normal.  Fistulas. These are abnormal passages in the rectum and anus.  Anal fissures. This is a tear in the anus.  Diverticulosis. This is a condition in which pockets or sacs project from the bowel.  Proctitis and colitis. These are conditions in which the rectum, colon, or anus become inflamed.  Polyps. These are growths that can be cancerous (malignant) or non-cancerous (benign).  Part of the rectum sticking out from the anus (rectal prolapse).  Certain medicines.  Intestinal infections. Follow these instructions at home: Pay attention to any changes in your symptoms. Take these actions to help lessen bleeding and discomfort:  Eat a diet that is high in fiber. This will keep your stool soft, making it easier to pass stools without straining. Ask your health care provider what foods and drinks are high in fiber.  Drink enough fluid to keep your urine clear or pale yellow. This also helps to keep your stool soft.  Try taking a warm bath. This may help soothe any pain in your rectum.  Keep all follow-up visits as told by your health care provider. This is important. Get help right away if:  You have new or increased rectal bleeding.  You have black or dark red stools.  You vomit blood or something that looks like coffee grounds.  You have pain or tenderness in your abdomen.  You have a fever.  You feel weak.  You feel nauseous.  You faint.  You have severe pain in your rectum.  You cannot have a bowel movement. This information is not  intended to replace advice given to you by your health care provider. Make sure you discuss any questions you have with your health care provider. Document Revised: 07/02/2016 Document Reviewed: 01/05/2016 Elsevier Patient Education  2020 Elsevier Inc.  

## 2020-01-26 NOTE — Telephone Encounter (Signed)
Requested Prescriptions  Pending Prescriptions Disp Refills  . fluticasone (FLONASE) 50 MCG/ACT nasal spray [Pharmacy Med Name: FLUTICASONE PROP 50 MCG SPRAY] 16 mL 6    Sig: SPRAY 2 SPRAYS INTO EACH NOSTRIL EVERY DAY     Ear, Nose, and Throat: Nasal Preparations - Corticosteroids Passed - 01/26/2020 10:19 AM      Passed - Valid encounter within last 12 months    Recent Outpatient Visits          1 month ago Encounter for annual physical exam   [facility] [place], Marzella Schlein, MD   7 months ago Rhus dermatitis   [facility], [place], [place]   8 months ago Essential hypertension   [facility], Marzella Schlein, MD   1 year ago Encounter for annual physical exam   [facility] [place], Marzella Schlein, MD   1 year ago Seasonal allergic rhinitis due to pollen   [facility], Alessandra Bevels, [place]      Future Appointments            Today Bacigalupo, Marzella Schlein, MD [facility], [facility]   In 4 months Bacigalupo, Marzella Schlein, MD [facility], [facility]

## 2020-01-26 NOTE — Telephone Encounter (Signed)
Pt called with complaints of blood in her stool intrmittently for the past 1-2 months; she says that initially she had blood on the tissue, a trace,  when she wiped; on 01/25/20 it was bright red and the size of a quarter on the tissue; the pt states she discussed it previously discussed this issue with her PCP; this is her 2nd episode of the larger amount of blood in her stool; she is concerned that this could be a change in her bowel pattern;the pt says that her stomach gripes after meals sometimes; she describes her stool as "soft"; she would like to be seen in office; recommendations made per nurse triage protocol; she verbalized understanding; algorithm completed; pt offered and accepted appt with Dr Beryle Flock, [facility], 01/26/20 at 1400; will route to office for notification.  Reason for Disposition . MILD rectal bleeding (more than just a few drops or streaks)  Answer Assessment - Initial Assessment Questions 1. APPEARANCE of BLOOD: "What color is it?" "Is it passed separately, on the surface of the stool, or mixed in with the stool?"      Bright red on tissue 2. AMOUNT: "How much blood was passed?"      Quarter sized 3. FREQUENCY: "How many times has blood been passed with the stools?"     Ongoing for the past month or 2 4. ONSET: "When was the blood first seen in the stools?" (Days or weeks)    1-2 months ago 5. DIARRHEA: "Is there also some diarrhea?" If so, ask: "How many diarrhea stools were passed in past 24 hours?"      no 6. CONSTIPATION: "Do you have constipation?" If so, "How bad is it?"     no 7. RECURRENT SYMPTOMS: "Have you had blood in your stools before?" If so, ask: "When was the last time?" and "What happened that time?"      Previously discussed with PCP; ongoing for the past 1-2 months 8. BLOOD THINNERS: "Do you take any blood thinners?" (e.g., Coumadin/warfarin, Pradaxa/dabigatran, aspirin)     no 9. OTHER SYMPTOMS: "Do you have any other symptoms?"  (e.g.,  abdominal pain, vomiting, dizziness, fever)    no 10. PREGNANCY: "Is there any chance you are pregnant?" "When was your last menstrual period?"     no  Protocols used: RECTAL BLEEDING-A-AH

## 2020-03-13 ENCOUNTER — Telehealth: Payer: Self-pay

## 2020-03-13 NOTE — Telephone Encounter (Signed)
Please advise 

## 2020-03-13 NOTE — Telephone Encounter (Signed)
She is up to date on screening colonoscopy until 2024 unless she is having issues, so if it has resolved, she can cancel the GI referral appt

## 2020-03-13 NOTE — Telephone Encounter (Signed)
Copied from CRM [id number]. Topic: General - Other >> Mar 13, 2020  2:51 PM [name] wrote: Reason for CRM: Pt called and stated she is not having the issue with her bowels anymore and wants to know if Dr. B thinks she should still have the colonoscopy done or if she should cancel it/ please advise /Pt would like a call back

## 2020-03-14 NOTE — Telephone Encounter (Signed)
Reviewed physician's note with the patient. She is no longer having any GI symptoms and will cancel the GI appointment. No further questions.

## 2020-03-14 NOTE — Telephone Encounter (Signed)
LMTCB, [facility] Triage Nurse may give patient results  

## 2020-03-21 ENCOUNTER — Ambulatory Visit: Payer: BC Managed Care – PPO | Admitting: Gastroenterology

## 2020-03-22 DIAGNOSIS — H5213 Myopia, bilateral: Secondary | ICD-10-CM | POA: Diagnosis not present

## 2020-05-17 ENCOUNTER — Emergency Department
Admission: EM | Admit: 2020-05-17 | Discharge: 2020-05-17 | Disposition: A | Discharge status: Home / Self Care | Payer: Managed Care, Other (non HMO) | Attending: Emergency Medicine | Admitting: Emergency Medicine

## 2020-05-17 ENCOUNTER — Ambulatory Visit: Payer: Self-pay

## 2020-05-17 ENCOUNTER — Other Ambulatory Visit: Payer: Self-pay

## 2020-05-17 DIAGNOSIS — I10 Essential (primary) hypertension: Secondary | ICD-10-CM | POA: Diagnosis present

## 2020-05-17 DIAGNOSIS — Z79899 Other long term (current) drug therapy: Secondary | ICD-10-CM | POA: Insufficient documentation

## 2020-05-17 LAB — CBC WITH DIFFERENTIAL/PLATELET
Abs Immature Granulocytes: 0.01 10*3/uL (ref 0.00–0.07)
Basophils Absolute: 0 10*3/uL (ref 0.0–0.1)
Basophils Relative: 0 %
Eosinophils Absolute: 0.2 10*3/uL (ref 0.0–0.5)
Eosinophils Relative: 3 %
HCT: 37 % (ref 36.0–46.0)
Hemoglobin: 12.6 g/dL (ref 12.0–15.0)
Immature Granulocytes: 0 %
Lymphocytes Relative: 46 %
Lymphs Abs: 3.4 10*3/uL (ref 0.7–4.0)
MCH: 32.1 pg (ref 26.0–34.0)
MCHC: 34.1 g/dL (ref 30.0–36.0)
MCV: 94.1 fL (ref 80.0–100.0)
Monocytes Absolute: 0.8 10*3/uL (ref 0.1–1.0)
Monocytes Relative: 10 %
Neutro Abs: 3 10*3/uL (ref 1.7–7.7)
Neutrophils Relative %: 41 %
Platelets: 200 10*3/uL (ref 150–400)
RBC: 3.93 MIL/uL (ref 3.87–5.11)
RDW: 12 % (ref 11.5–15.5)
WBC: 7.4 10*3/uL (ref 4.0–10.5)
nRBC: 0 % (ref 0.0–0.2)

## 2020-05-17 LAB — TROPONIN I (HIGH SENSITIVITY): Troponin I (High Sensitivity): 4 ng/L (ref <18)

## 2020-05-17 LAB — BASIC METABOLIC PANEL
Anion gap: 10 (ref 5–15)
BUN: 13 mg/dL (ref 6–20)
CO2: 28 mmol/L (ref 22–32)
Calcium: 9.3 mg/dL (ref 8.9–10.3)
Chloride: 101 mmol/L (ref 98–111)
Creatinine, Ser: 1.01 mg/dL — ABNORMAL HIGH (ref 0.44–1.00)
GFR calc Af Amer: >60 mL/min (ref >60)
GFR calc non Af Amer: >60 mL/min (ref >60)
Glucose, Bld: 99 mg/dL (ref 70–99)
Potassium: 3.4 mmol/L — ABNORMAL LOW (ref 3.5–5.1)
Sodium: 139 mmol/L (ref 135–145)

## 2020-05-17 MED ORDER — HYDRALAZINE HCL 20 MG/ML IJ SOLN
5.0000 mg | Freq: Once | INTRAMUSCULAR | Status: AC
  Administered 2020-05-17: 5 mg via INTRAVENOUS
  Filled 2020-05-17: qty 1

## 2020-05-17 NOTE — Discharge Instructions (Addendum)
Please seek medical attention for any high fevers, chest pain, shortness of breath, change in behavior, persistent vomiting, bloody stool or any other new or concerning symptoms.  

## 2020-05-17 NOTE — ED Notes (Signed)
Dr. Derrill Kay made aware of pt's BP. Dr. Derrill Kay acknowledged no new orders received

## 2020-05-17 NOTE — ED Notes (Signed)
Provided pt with a remote control

## 2020-05-17 NOTE — Telephone Encounter (Signed)
Also needs to stop taking Naproxen and go to ER if develops any stroke symptoms or chest pain.

## 2020-05-17 NOTE — ED Triage Notes (Signed)
First RN Note: Pt to ED via wheelchair from [facility] with c/o HTN, per [facility] RN pt with hx of HTN and is on meds, BP today 200 systolic.

## 2020-05-17 NOTE — ED Notes (Addendum)
Pt resting comfortable. 

## 2020-05-17 NOTE — Telephone Encounter (Signed)
Patient called from the nurse's office at work after going down to see her due to a full feeling in her head. The nurse says she took the patient's BP when she arrived in the office around 1430 and it was 166/97 left arm, 174/103 right arm and now 186/99. She says she started taking Naproxen 500 mg BID on Tuesday for tendonitis in the knee prescribed by the orthopedic doctor. She says since she started taking it, her head felt full, no headache, no dizziness. She says she didn't take it last night or this morning. She went to the nurse because she was still filling that way at work, but by the time she went to the nurse at 1430, the head fullness went away. She denies any other symptoms and says she's taking her BP medication as prescribed. According to protocol, see PCP within 3 days, appointment scheduled for Tuesday, 05/21/20 at 0920 with Dr. Beryle Flock, care advice given, she verbalized understanding. Advised to check her BP daily this weekend and if it's not going down to go to the UC, she verbalized understanding.  Reason for Disposition . Systolic BP  >= 180 OR Diastolic >= 110 . Systolic BP  >= 160 OR Diastolic >= 100  Answer Assessment - Initial Assessment Questions 1. BLOOD PRESSURE: "What is the blood pressure?" "Did you take at least two measurements 5 minutes apart?"     186/99; 174/103 RA; LA 166/97 2. ONSET: "When did you take your blood pressure?"      Last 30 minutes 3. HOW: "How did you obtain the blood pressure?" (e.g., visiting nurse, automatic home BP monitor)     Automatic cuff at work 4. HISTORY: "Do you have a history of high blood pressure?"     Yes 5. MEDICATIONS: "Are you taking any medications for blood pressure?" "Have you missed any doses recently?"     Yes, no missed doses 6. OTHER SYMPTOMS: "Do you have any symptoms?" (e.g., headache, chest pain, blurred vision, difficulty breathing, weakness)     Felt fullness in head for a couple of days after taking Naproxen 500 mg  BID 7. PREGNANCY: "Is there any chance you are pregnant?" "When was your last menstrual period?"     No  Protocols used: BLOOD PRESSURE - HIGH-A-AH

## 2020-05-17 NOTE — ED Provider Notes (Signed)
[facility] Emergency Department Provider Note   ____________________________________________   I have reviewed the triage vital signs and the nursing notes.   HISTORY  Chief Complaint Hypertension   History limited by: Not Limited   HPI Caroline Bowen is a 59 y.o. female who presents to the emergency department today because of concerns for high blood pressure.  The patient states that she went to her primary care doctor earlier in the week because of some left knee pain and was prescribed naproxen.  Since starting the naproxen she has developed a slight sense of fullness behind her forehead.  She had her blood pressure checked at work today and it was noted to be elevated.  Patient states she does take medication for blood pressure and has never had such high blood pressure readings in the past.  Patient denies any weakness to her extremities.  Denies any significant numbness or tingling to her extremities, save for a brief episode of numbness to her mid left forearm which resolved.  Patient denies any chest pain or shortness of breath.  Records reviewed. Per medical record review patient has a history of hypertension.   Past Medical History:  Diagnosis Date  . Anemia   . Hypertension     Patient Active Problem List   Diagnosis Date Noted  . Overweight 12/04/2019  . Hyperlipidemia 05/29/2019  . Menopausal disorder 06/05/2015  . History of anemia 05/24/2015  . Accumulation of fluid in tissues 05/24/2015  . Abnormal finding on breast imaging 04/16/2010  . Essential hypertension 09/06/2008  . Headache disorder 09/06/2008    Past Surgical History:  Procedure Laterality Date  . CARDIAC CATHETERIZATION  06/18/2016   Normal coronaries. Normal EF=60%. Dr. Welton Flakes  . CYST EXCISION  2001   wrist  . wrist surgery  2001    Prior to Admission medications   Medication Sig Start Date End Date Taking? Authorizing Provider  estradiol (CLIMARA - DOSED IN MG/24  HR) 0.05 mg/24hr patch Place 1 patch (0.05 mg total) onto the skin once a week. 07/24/19   Erasmo Downer, MD  fluticasone (FLONASE) 50 MCG/ACT nasal spray SPRAY 2 SPRAYS INTO EACH NOSTRIL EVERY DAY 01/26/20   Erasmo Downer, MD  triamterene-hydrochlorothiazide (MAXZIDE-25) 37.5-25 MG tablet TAKE 1/2 TABLET BY MOUTH EVERY DAY 12/30/19   Bacigalupo, Marzella Schlein, MD  vitamin C (ASCORBIC ACID) 500 MG tablet Take 500 mg by mouth daily.    [provider]    Allergies Metoprolol  Family History  Problem Relation Age of Onset  . Hypertension Mother   . Cancer Mother        ovarian  . Ovarian cancer Mother   . Hypertension Father   . Diabetes Father   . Kidney disease Father   . Prostate cancer Father   . Heart failure Father   . Diabetes Sister   . Hypertension Sister   . Hypertension Brother   . Stroke Brother   . Hypertension Sister   . Pancreatic cancer Brother   . Hypertension Brother   . Breast cancer Neg Hx   . Colon cancer Neg Hx     Social History Social History   Tobacco Use  . Smoking status: Never Smoker  . Smokeless tobacco: Never Used  Vaping Use  . Vaping Use: Never used  Substance Use Topics  . Alcohol use: No    Comment: very rare  . Drug use: No    Review of Systems Constitutional: No fever/chills Eyes: No  visual changes. ENT: No sore throat. Cardiovascular: Denies chest pain. Respiratory: Denies shortness of breath. Gastrointestinal: No abdominal pain.  No nausea, no vomiting.  No diarrhea.   Genitourinary: Negative for dysuria. Musculoskeletal: Negative for back pain. Skin: Negative for rash. Neurological: Positive for headache.  ____________________________________________   PHYSICAL EXAM:  VITAL SIGNS: ED Triage Vitals [05/17/20 1912]  Enc Vitals Group     BP (!) 211/109     Pulse Rate 69     Resp 16     Temp 98.5 F (36.9 C)     Temp Source Oral     SpO2 99 %     Weight 177 lb (80.3 kg)     Height 5\' 6"  (1.676 m)      Head Circumference      Peak Flow      Pain Score 0   Constitutional: Alert and oriented.  Eyes: Conjunctivae are normal.  ENT      Head: Normocephalic and atraumatic.      Nose: No congestion/rhinnorhea.      Mouth/Throat: Mucous membranes are moist.      Neck: No stridor. Hematological/Lymphatic/Immunilogical: No cervical lymphadenopathy. Cardiovascular: Normal rate, regular rhythm.  No murmurs, rubs, or gallops. Respiratory: Normal respiratory effort without tachypnea nor retractions. Breath sounds are clear and equal bilaterally. No wheezes/rales/rhonchi. Gastrointestinal: Soft and non tender. No rebound. No guarding.  Genitourinary: Deferred Musculoskeletal: Normal range of motion in all extremities. No lower extremity edema. Neurologic:  Normal speech and language. No gross focal neurologic deficits are appreciated.  Skin:  Skin is warm, dry and intact. No rash noted. Psychiatric: Mood and affect are normal. Speech and behavior are normal. Patient exhibits appropriate insight and judgment.  ____________________________________________    LABS (pertinent positives/negatives)  Trop hs 4 CBC wbc 7.4, hgb 12.6, plt 200 BMP wnl except k 3.4, cr 1.01  ____________________________________________   EKG  I, Nance Pear, attending physician, personally viewed and interpreted this EKG  EKG Time: 1911 Rate: 67 Rhythm: normal sinus rhythm Axis: normal Intervals: qtc 443 QRS: narrow ST changes: no st elevation, t wave inversion II, III, aVF, V3-V6 Impression: abnormal ekg  ____________________________________________    RADIOLOGY  None  ____________________________________________   PROCEDURES  Procedures  ____________________________________________   INITIAL IMPRESSION / ASSESSMENT AND PLAN / ED COURSE  Pertinent labs & imaging results that were available during my care of the patient were reviewed by me and considered in my medical decision making  (see chart for details).   Patient presented to the emergency department today because of concerns for elevated blood pressure.  The only real symptom the patient appears to have a some discomfort in the front of her head.  Blood work here without concerning findings for endorgan damage.  Patient was given dose of IV antihypertensive medication here.  Discussed with the patient that she could take full dose of her blood pressure medications for the next couple of days.  She does have follow-up appointment with already scheduled for early next week.  ____________________________________________   FINAL CLINICAL IMPRESSION(S) / ED DIAGNOSES  Final diagnoses:  Hypertension, unspecified type     Note: This dictation was prepared with Dragon dictation. Any transcriptional errors that result from this process are unintentional     Nance Pear, MD 05/17/20 2307

## 2020-05-17 NOTE — ED Triage Notes (Signed)
Pt to the er for HTN. Pt takes HCTZ. Dosage has not been adjusted in years. Pt states she is asymptomatic. Pt stated on Wednesday and Thursday she felt some fullness in her head but no pain.

## 2020-05-17 NOTE — Telephone Encounter (Signed)
Pt advised.   Thanks,   -Zaneta Lightcap  

## 2020-05-20 ENCOUNTER — Encounter: Payer: Self-pay | Admitting: Physician Assistant

## 2020-05-20 ENCOUNTER — Other Ambulatory Visit: Payer: Self-pay

## 2020-05-20 ENCOUNTER — Ambulatory Visit: Payer: Managed Care, Other (non HMO) | Admitting: Physician Assistant

## 2020-05-20 ENCOUNTER — Telehealth: Payer: Self-pay

## 2020-05-20 VITALS — BP 169/94 | HR 66 | Temp 98.7°F | Resp 16 | Ht 66.0 in | Wt 176.6 lb

## 2020-05-20 DIAGNOSIS — N959 Unspecified menopausal and perimenopausal disorder: Secondary | ICD-10-CM | POA: Diagnosis not present

## 2020-05-20 DIAGNOSIS — I10 Essential (primary) hypertension: Secondary | ICD-10-CM

## 2020-05-20 MED ORDER — CLONIDINE HCL 0.1 MG PO TABS: 0.1000 mg | ORAL_TABLET | Freq: Every day | ORAL | 3 refills | Status: DC

## 2020-05-20 NOTE — Telephone Encounter (Signed)
Copied from CRM [id number]. Topic: Appointment Scheduling - Scheduling Inquiry for Clinic >> May 20, 2020  8:04 AM Gwenlyn Fudge wrote: Reason for CRM: Pt called stating that she is still having consistently high BP readings. PT has an appt scheduled for tomorrow, but is requesting to be seen today. Pt was seen in the ER this weekend. Please advise.

## 2020-05-20 NOTE — Progress Notes (Signed)
Established patient visit   Patient: Caroline Bowen   DOB: Apr 17, 1961   59 y.o. Female  MRN: [id number] Visit Date: 05/20/2020  Today's healthcare provider: Trey Sailors, PA-C   Chief Complaint  Patient presents with  . Hypertension   Subjective    HPI Follow up ER visit  Patient was seen in ER for elevated BP on 05/17/2020. She was treated for elevated BP. Treatment for this included Hydralazine 5mg  via IV . She reports good compliance with treatment. She reports this condition is Unchanged.  BP Readings from Last 3 Encounters:  05/20/20 (!) 169/94  05/17/20 (!) 178/105  01/26/20 124/76    Patient reports that she has taken a whole tablet of Triamterene/HCTZ 37.5mg 25mg  since 6/26 which is increased from half a tablet. She reports that she has not noticed any changes in her BP. Her BP is averaging in the 170s/90s. She denies chest pain, blurred vision, headache, and dizziness. She reports that she does walk a mile every morning. She is currently using HRT through weekly patch. She has been on this for at least 5-6 years, originally started by Dr. .      Medications: Outpatient Medications Prior to Visit  Medication Sig  . estradiol (CLIMARA - DOSED IN MG/24 HR) 0.05 mg/24hr patch Place 1 patch (0.05 mg total) onto the skin once a week.  . fluticasone (FLONASE) 50 MCG/ACT nasal spray SPRAY 2 SPRAYS INTO EACH NOSTRIL EVERY DAY  . triamterene-hydrochlorothiazide (MAXZIDE-25) 37.5-25 MG tablet TAKE 1/2 TABLET BY MOUTH EVERY DAY  . vitamin C (ASCORBIC ACID) 500 MG tablet Take 500 mg by mouth daily.   No facility-administered medications prior to visit.    Review of Systems    Objective    BP (!) 169/94   Pulse 66   Temp 98.7 F (37.1 C)   Resp 16   Ht 5\' 6"  (1.676 m)   Wt 176 lb 9.6 oz (80.1 kg)   BMI 28.50 kg/m    Physical Exam Constitutional:      Appearance: Normal appearance.  Cardiovascular:     Rate and Rhythm: Normal rate and regular  rhythm.  Pulmonary:     Effort: Pulmonary effort is normal.     Breath sounds: Normal breath sounds.  Skin:    General: Skin is warm and dry.  Neurological:     Mental Status: She is alert and oriented to person, place, and time. Mental status is at baseline.  Psychiatric:        Mood and Affect: Mood normal.        Behavior: Behavior normal.       No results found for any visits on 05/20/20.  Assessment & Plan    1. Essential hypertension  At this time, patient does not want to start another blood pressure medicine right away. I would recommend trial off HRT to see how blood pressure responds. If BP lower but remains elevated, can start clonidine as below to address both menopausal symptoms and BP issues. Follow up in 4 weeks.   - cloNIDine (CATAPRES) 0.1 MG tablet; Take 1 tablet (0.1 mg total) by mouth daily.  Dispense: 30 tablet; Refill: 3  2. Menopausal disorder  See above.    Return in about 4 weeks (around 06/17/2020).      I , PA-C, have reviewed all documentation for this visit. The documentation on 05/21/20 for the exam, diagnosis, procedures, and orders are all accurate and complete.  Paulene Floor  [facility] [phone] (phone) [phone] (fax)  [place]

## 2020-05-20 NOTE — Telephone Encounter (Signed)
Pt has an appt scheduled for today at 3:20pm. She denies any headaches, blurred vision, dizziness, or chest pain. She reports that she has taken her BP medication today and SBP was around 170. Advised if symptoms worsen before appt to call back or go to the ER. Patient verbalized understanding.

## 2020-05-21 ENCOUNTER — Ambulatory Visit: Payer: Self-pay | Admitting: Family Medicine

## 2020-06-06 ENCOUNTER — Ambulatory Visit: Payer: BC Managed Care – PPO | Admitting: Family Medicine

## 2020-06-17 NOTE — Progress Notes (Signed)
Established patient visit   Patient: Caroline Bowen   DOB: 01-02-1961   59 y.o. Female  MRN: [id number] Visit Date: 06/19/2020  Today's healthcare provider: Trey Sailors, PA-C   Chief Complaint  Patient presents with  . Hypertension  I,Teigan Manner M Kiosha Buchan,acting as a scribe for [employer], PA-C.,have documented all relevant documentation on the behalf of Trey Sailors, PA-C,as directed by  Trey Sailors, PA-C while in the presence of Trey Sailors, PA-C.   Subjective    HPI  Hypertension, follow-up  BP Readings from Last 3 Encounters:  06/19/20 (!) 132/84  05/20/20 (!) 169/94  05/17/20 (!) 178/105   Wt Readings from Last 3 Encounters:  06/19/20 175 lb 3.2 oz (79.5 kg)  05/20/20 176 lb 9.6 oz (80.1 kg)  05/17/20 177 lb (80.3 kg)     She was last seen for hypertension 4 weeks ago.  BP at that visit was 169/94. Management since that visit includes started on clonidine. She has also stopped her HRT. She reports that after stopping the HRT she noticed a significant reduction in BP. She reports the clonidine is not as helpful for menopausal symptoms and she has noticed hot flashes returning.   She reports good compliance with treatment. She is not having side effects.  She is following a Regular, Low Sodium diet. She is exercising. She does not smoke.  Use of agents associated with hypertension: none.   Outside blood pressures are 130's/80's. Symptoms: No chest pain No chest pressure  No palpitations No syncope  No dyspnea No orthopnea  No paroxysmal nocturnal dyspnea No lower extremity edema   Pertinent labs: Lab Results  Component Value Date   CHOL 264 (H) 12/04/2019   HDL 80 12/04/2019   LDLCALC 160 (H) 12/04/2019   TRIG 137 12/04/2019   CHOLHDL 3.3 12/04/2019   Lab Results  Component Value Date   NA 139 05/17/2020   K 3.4 (L) 05/17/2020   CREATININE 1.01 (H) 05/17/2020   GFRNONAA >60 05/17/2020   GFRAA >60 05/17/2020   GLUCOSE 99  05/17/2020     The 10-year ASCVD risk score Denman George DC Jr., et al., 2013) is: 7.1%   ---------------------------------------------------------------------------------------------------      Medications: Outpatient Medications Prior to Visit  Medication Sig  . cloNIDine (CATAPRES) 0.1 MG tablet Take 1 tablet (0.1 mg total) by mouth daily.  Marland Kitchen estradiol (CLIMARA - DOSED IN MG/24 HR) 0.05 mg/24hr patch Place 1 patch (0.05 mg total) onto the skin once a week.  . fluticasone (FLONASE) 50 MCG/ACT nasal spray SPRAY 2 SPRAYS INTO EACH NOSTRIL EVERY DAY  . triamterene-hydrochlorothiazide (MAXZIDE-25) 37.5-25 MG tablet TAKE 1/2 TABLET BY MOUTH EVERY DAY  . vitamin C (ASCORBIC ACID) 500 MG tablet Take 500 mg by mouth daily.   No facility-administered medications prior to visit.    Review of Systems  Constitutional: Negative.   Respiratory: Negative.   Cardiovascular: Negative.   Hematological: Negative.       Objective    BP (!) 132/84 (BP Location: Left Arm, Patient Position: Sitting, Cuff Size: Normal)   Pulse 79   Temp (!) 96.9 F (36.1 C) (Temporal)   Wt 175 lb 3.2 oz (79.5 kg)   SpO2 98%   BMI 28.28 kg/m    Physical Exam Constitutional:      Appearance: Normal appearance.  Cardiovascular:     Rate and Rhythm: Normal rate and regular rhythm.     Pulses: Normal pulses.  Heart sounds: Normal heart sounds.  Pulmonary:     Effort: Pulmonary effort is normal.     Breath sounds: Normal breath sounds.  Skin:    General: Skin is warm and dry.  Neurological:     General: No focal deficit present.     Mental Status: She is alert and oriented to person, place, and time.  Psychiatric:        Mood and Affect: Mood normal.        Behavior: Behavior normal.       No results found for any visits on 06/19/20.  Assessment & Plan    1. Essential hypertension Well controlled Continue current medications.   2. Vasomotor Symptoms  She is taking clonidine which she reports  is not as effective as the HRT. Offered Paxil which she declines. She would like to continue clonidine for now and this may have the added benefit of helping her BP.       Return in about 6 months (around 12/20/2020).      ITrey Sailors, PA-C, have reviewed all documentation for this visit. The documentation on 06/20/20 for the exam, diagnosis, procedures, and orders are all accurate and complete.    Maryella Shivers  [facility] [phone] (phone) [phone] (fax)  [facility]

## 2020-06-19 ENCOUNTER — Other Ambulatory Visit: Payer: Self-pay

## 2020-06-19 ENCOUNTER — Encounter: Payer: Self-pay | Admitting: Physician Assistant

## 2020-06-19 ENCOUNTER — Ambulatory Visit: Payer: Managed Care, Other (non HMO) | Admitting: Physician Assistant

## 2020-06-19 VITALS — BP 132/84 | HR 79 | Temp 96.9°F | Wt 175.2 lb

## 2020-06-19 DIAGNOSIS — I10 Essential (primary) hypertension: Secondary | ICD-10-CM | POA: Diagnosis not present

## 2020-06-19 DIAGNOSIS — N951 Menopausal and female climacteric states: Secondary | ICD-10-CM

## 2020-06-19 NOTE — Patient Instructions (Signed)

## 2020-07-24 ENCOUNTER — Other Ambulatory Visit: Payer: Self-pay | Admitting: Family Medicine

## 2020-07-24 DIAGNOSIS — I10 Essential (primary) hypertension: Secondary | ICD-10-CM

## 2020-07-24 MED ORDER — TRIAMTERENE-HCTZ 37.5-25 MG PO TABS: 1.0000 | ORAL_TABLET | Freq: Every day | ORAL | 1 refills | Status: DC

## 2020-07-24 NOTE — Telephone Encounter (Signed)
Pt called in to request a new Rx for triamterene-hydrochlorothiazide (MAXZIDE-25) 37.5-25 MG tablet. Pt says that her dose was changed to 1 whole pill. Pt says that she need a new Rx sent in to pharmacy. Pt says that she is out. Pt would like to have sent  In to pharmacy below.    Pharmacy: [id number] - [place], [place] - [address]  [address], [place] [address]

## 2020-07-24 NOTE — Telephone Encounter (Signed)
I spoke with Caroline Bowen she has been taking a 1 tablet since her visit to the ER 05/17/2020.  Ricki Rodriguez also added clonidine 0.1mg  at her follow up on 05/20/2020.  Pt states she is tolerating the change well and would like for the rx to read 1 daily.   Thanks,   -Vernona Rieger

## 2020-08-24 ENCOUNTER — Other Ambulatory Visit: Payer: Self-pay | Admitting: Physician Assistant

## 2020-08-24 DIAGNOSIS — I10 Essential (primary) hypertension: Secondary | ICD-10-CM

## 2020-08-24 NOTE — Telephone Encounter (Signed)
Requested Prescriptions  Pending Prescriptions Disp Refills  . cloNIDine (CATAPRES) 0.1 MG tablet [Pharmacy Med Name: CLONIDINE HCL 0.1 MG TABLET] 90 tablet 1    Sig: TAKE 1 TABLET (0.1 MG TOTAL) BY MOUTH DAILY.     Cardiovascular:  Alpha-2 Agonists Passed - 08/24/2020  9:11 AM      Passed - Last BP in normal range    BP Readings from Last 1 Encounters:  06/19/20 (!) 132/84         Passed - Last Heart Rate in normal range    Pulse Readings from Last 1 Encounters:  06/19/20 79         Passed - Valid encounter within last 6 months    Recent Outpatient Visits          2 months ago Essential hypertension   [facility] [place], Lavella Hammock, [place]   3 months ago Essential hypertension   [facility] Os[place]do Angst M, [place]   7 months ago Rectal bleeding   [facility] [place], Marzella Schlein, [place]   8 months ago Encounter for annual physical exam   [facility], Marzella Schlein, [place]   1 year ago Rhus dermatitis   [facility], Jodell Cipro, [place]      Future Appointments            In 3 months Bacigalupo, Marzella Schlein, MD [facility], [facility]

## 2020-12-02 ENCOUNTER — Other Ambulatory Visit: Payer: Self-pay | Admitting: Family Medicine

## 2020-12-02 DIAGNOSIS — Z1231 Encounter for screening mammogram for malignant neoplasm of breast: Secondary | ICD-10-CM

## 2020-12-04 ENCOUNTER — Encounter: Payer: Self-pay | Admitting: Family Medicine

## 2020-12-05 ENCOUNTER — Other Ambulatory Visit: Payer: Self-pay

## 2020-12-05 ENCOUNTER — Ambulatory Visit (INDEPENDENT_AMBULATORY_CARE_PROVIDER_SITE_OTHER): Payer: Managed Care, Other (non HMO) | Admitting: Family Medicine

## 2020-12-05 ENCOUNTER — Encounter: Payer: Self-pay | Admitting: Family Medicine

## 2020-12-05 VITALS — BP 149/88 | HR 73 | Temp 98.3°F | Ht 66.0 in | Wt 175.0 lb

## 2020-12-05 DIAGNOSIS — Z862 Personal history of diseases of the blood and blood-forming organs and certain disorders involving the immune mechanism: Secondary | ICD-10-CM

## 2020-12-05 DIAGNOSIS — E782 Mixed hyperlipidemia: Secondary | ICD-10-CM

## 2020-12-05 DIAGNOSIS — Z0000 Encounter for general adult medical examination without abnormal findings: Secondary | ICD-10-CM | POA: Diagnosis not present

## 2020-12-05 DIAGNOSIS — I10 Essential (primary) hypertension: Secondary | ICD-10-CM

## 2020-12-05 DIAGNOSIS — R739 Hyperglycemia, unspecified: Secondary | ICD-10-CM | POA: Insufficient documentation

## 2020-12-05 DIAGNOSIS — E663 Overweight: Secondary | ICD-10-CM

## 2020-12-05 DIAGNOSIS — N951 Menopausal and female climacteric states: Secondary | ICD-10-CM | POA: Insufficient documentation

## 2020-12-05 MED ORDER — GABAPENTIN 300 MG PO CAPS: 300.0000 mg | ORAL_CAPSULE | Freq: Three times a day (TID) | ORAL | 3 refills | Status: DC

## 2020-12-05 NOTE — Patient Instructions (Signed)
Preventive Care 84-60 Years Old, Female Preventive care refers to lifestyle choices and visits with your health care provider that can promote health and wellness. This includes:  A yearly physical exam. This is also called an annual wellness visit.  Regular dental and eye exams.  Immunizations.  Screening for certain conditions.  Healthy lifestyle choices, such as: ? Eating a healthy diet. ? Getting regular exercise. ? Not using drugs or products that contain nicotine and tobacco. ? Limiting alcohol use. What can I expect for my preventive care visit? Physical exam Your health care provider will check your:  Height and weight. These may be used to calculate your BMI (body mass index). BMI is a measurement that tells if you are at a healthy weight.  Heart rate and blood pressure.  Body temperature.  Skin for abnormal spots. Counseling Your health care provider may ask you questions about your:  Past medical problems.  Family's medical history.  Alcohol, tobacco, and drug use.  Emotional well-being.  Home life and relationship well-being.  Sexual activity.  Diet, exercise, and sleep habits.  Work and work Statistician.  Access to firearms.  Method of birth control.  Menstrual cycle.  Pregnancy history. What immunizations do I need? Vaccines are usually given at various ages, according to a schedule. Your health care provider will recommend vaccines for you based on your age, medical history, and lifestyle or other factors, such as travel or where you work.   What tests do I need? Blood tests  Lipid and cholesterol levels. These may be checked every 5 years, or more often if you are over 60 years old.  Hepatitis C test.  Hepatitis B test. Screening  Lung cancer screening. You may have this screening every year starting at age 60 if you have a 30-pack-year history of smoking and currently smoke or have quit within the past 15 years.  Colorectal cancer  screening. ? All adults should have this screening starting at age 60 and continuing until age 17. ? Your health care provider may recommend screening at age 60 if you are at increased risk. ? You will have tests every 1-10 years, depending on your results and the type of screening test.  Diabetes screening. ? This is done by checking your blood sugar (glucose) after you have not eaten for a while (fasting). ? You may have this done every 1-3 years.  Mammogram. ? This may be done every 1-2 years. ? Talk with your health care provider about when you should start having regular mammograms. This may depend on whether you have a family history of breast cancer.  BRCA-related cancer screening. This may be done if you have a family history of breast, ovarian, tubal, or peritoneal cancers.  Pelvic exam and Pap test. ? This may be done every 3 years starting at age 60. ? Starting at age 11, this may be done every 5 years if you have a Pap test in combination with an HPV test. Other tests  STD (sexually transmitted disease) testing, if you are at risk.  Bone density scan. This is done to screen for osteoporosis. You may have this scan if you are at high risk for osteoporosis. Talk with your health care provider about your test results, treatment options, and if necessary, the need for more tests. Follow these instructions at home: Eating and drinking  Eat a diet that includes fresh fruits and vegetables, whole grains, lean protein, and low-fat dairy products.  Take vitamin and mineral supplements  as recommended by your health care provider.  Do not drink alcohol if: ? Your health care provider tells you not to drink. ? You are pregnant, may be pregnant, or are planning to become pregnant.  If you drink alcohol: ? Limit how much you have to 0-1 drink a day. ? Be aware of how much alcohol is in your drink. In the [place] one drink equals one 12 oz bottle of beer (355 mL), one 5 oz glass of  wine (148 mL), or one 1 oz glass of hard liquor (44 mL).   Lifestyle  Take daily care of your teeth and gums. Brush your teeth every morning and night with fluoride toothpaste. Floss one time each day.  Stay active. Exercise for at least 30 minutes 5 or more days each week.  Do not use any products that contain nicotine or tobacco, such as cigarettes, e-cigarettes, and chewing tobacco. If you need help quitting, ask your health care provider.  Do not use drugs.  If you are sexually active, practice safe sex. Use a condom or other form of protection to prevent STIs (sexually transmitted infections).  If you do not wish to become pregnant, use a form of birth control. If you plan to become pregnant, see your health care provider for a prepregnancy visit.  If told by your health care provider, take low-dose aspirin daily starting at age 60.  Find healthy ways to cope with stress, such as: ? Meditation, yoga, or listening to music. ? Journaling. ? Talking to a trusted person. ? Spending time with friends and family. Safety  Always wear your seat belt while driving or riding in a vehicle.  Do not drive: ? If you have been drinking alcohol. Do not ride with someone who has been drinking. ? When you are tired or distracted. ? While texting.  Wear a helmet and other protective equipment during sports activities.  If you have firearms in your house, make sure you follow all gun safety procedures. What's next?  Visit your health care provider once a year for an annual wellness visit.  Ask your health care provider how often you should have your eyes and teeth checked.  Stay up to date on all vaccines. This information is not intended to replace advice given to you by your health care provider. Make sure you discuss any questions you have with your health care provider. Document Revised: 08/13/2020 Document Reviewed: 07/21/2018 Elsevier Patient Education  2021 Elsevier Inc.  

## 2020-12-05 NOTE — Assessment & Plan Note (Addendum)
Clonidine is not helping with the hot flashes May be causing some rebound HTN D/c clonidine Start Gabapentin once nightly

## 2020-12-05 NOTE — Progress Notes (Signed)
Complete physical exam   Patient: Caroline Bowen   DOB: 1961-04-11   60 y.o. Female  MRN: [id number] Visit Date: 12/05/2020  Today's healthcare provider: Shirlee Latch, MD   Chief Complaint  Patient presents with   Annual Exam   Subjective    Caroline Bowen is a 60 y.o. female who presents today for a complete physical exam.  She reports consuming a general diet. Gym/ health club routine includes mod to heavy weightlifting and walking on track . She generally feels well. She reports sleeping well. She does have additional problems to discuss today.  HPI   Ms. Caroline Bowen reports that her hot flashes are not well controlled and she would prefer not to take the clonidine if it is not helping. She also notes some numbness in her hands that occurs at night. She is occasionally woken up by the feeling of 'my hand falling asleep'. She is able to return her hand no normal feeling after a few minutes of massage and changing her sleep position. She has not other complaints.    Past Medical History:  Diagnosis Date   Anemia    Hypertension    Past Surgical History:  Procedure Laterality Date   CARDIAC CATHETERIZATION  06/18/2016   Normal coronaries. Normal EF=60%. Dr. Welton Flakes   CYST EXCISION  2001   wrist   wrist surgery  2001   Social History   Socioeconomic History   Marital status: Married    Spouse name: Trinna Post   Number of children: 1   Years of education: 16   Highest education level: Bachelor's degree (e.g., BA, AB, BS)  Occupational History   Not on file  Tobacco Use   Smoking status: Never Smoker   Smokeless tobacco: Never Used  Vaping Use   Vaping Use: Never used  Substance and Sexual Activity   Alcohol use: No    Comment: very rare   Drug use: No   Sexual activity: Yes    Birth control/protection: Post-menopausal  Other Topics Concern   Not on file  Social History Narrative   Not on file   Social Determinants of Health   Financial  Resource Strain: Not on file  Food Insecurity: Not on file  Transportation Needs: Not on file  Physical Activity: Not on file  Stress: Not on file  Social Connections: Not on file  Intimate Partner Violence: Not on file   Family Status  Relation Name Status   Mother  Alive   Father  Deceased   Sister  Alive   Brother  Alive   Sister  Alive   Brother  Deceased   Brother  Alive   Neg Hx  (Not Specified)   Family History  Problem Relation Age of Onset   Hypertension Mother    Cancer Mother        ovarian   Ovarian cancer Mother    Hypertension Father    Diabetes Father    Kidney disease Father    Prostate cancer Father    Heart failure Father    Diabetes Sister    Hypertension Sister    Hypertension Brother    Stroke Brother    Hypertension Sister    Pancreatic cancer Brother    Hypertension Brother    Breast cancer Neg Hx    Colon cancer Neg Hx    Allergies  Allergen Reactions   Metoprolol Swelling    Patient Care Team: Erasmo Downer, MD as PCP - General (  Family Medicine)   Medications: Outpatient Medications Prior to Visit  Medication Sig   triamterene-hydrochlorothiazide (MAXZIDE-25) 37.5-25 MG tablet Take 1 tablet by mouth daily.   vitamin C (ASCORBIC ACID) 500 MG tablet Take 500 mg by mouth daily.   [DISCONTINUED] cloNIDine (CATAPRES) 0.1 MG tablet TAKE 1 TABLET (0.1 MG TOTAL) BY MOUTH DAILY.   [DISCONTINUED] estradiol (CLIMARA - DOSED IN MG/24 HR) 0.05 mg/24hr patch Place 1 patch (0.05 mg total) onto the skin once a week. (Patient not taking: Reported on 12/05/2020)   [DISCONTINUED] fluticasone (FLONASE) 50 MCG/ACT nasal spray SPRAY 2 SPRAYS INTO EACH NOSTRIL EVERY DAY (Patient not taking: Reported on 12/05/2020)   No facility-administered medications prior to visit.    Review of Systems  Constitutional: Negative.   HENT: Negative.   Eyes: Negative.   Respiratory: Negative.   Cardiovascular: Negative.    Gastrointestinal: Negative.   Endocrine: Positive for heat intolerance.  Genitourinary: Negative.   Musculoskeletal: Negative.   Skin: Negative.   Allergic/Immunologic: Negative.   Neurological: Negative.   Hematological: Negative.   Psychiatric/Behavioral: Negative.       Objective    BP (!) 149/88 (BP Location: Left Arm, Patient Position: Sitting, Cuff Size: Large)    Pulse 73    Temp 98.3 F (36.8 C) (Oral)    Ht 5\' 6"  (1.676 m)    Wt 175 lb (79.4 kg)    SpO2 100%    BMI 28.25 kg/m    Physical Exam   General: Well appearing, in NAD Lungs: CTA bilaterally, Normal work of breathing Cards: RRR no murmurs rubs or gallops  GI: Soft Non-tender, Non-distended Derm: No observable rashes HEENT: MMM, no cervical lymphadenopathy, tympanic membranes clear.  Extremities: No leg swelling Psych: Alert and oriented  Mood: "good"  Affect: mood congruent, euthymic   Last depression screening scores PHQ 2/9 Scores 12/05/2020 12/04/2019 11/28/2018  PHQ - 2 Score 0 0 0  PHQ- 9 Score 2 0 0   Last fall risk screening Fall Risk  12/05/2020  Falls in the past year? 1  Number falls in past yr: 0  Injury with Fall? 0  Risk for fall due to : No Fall Risks  Follow up Falls evaluation completed   Last Audit-C alcohol use screening Alcohol Use Disorder Test (AUDIT) 12/05/2020  1. How often do you have a drink containing alcohol? 1  2. How many drinks containing alcohol do you have on a typical day when you are drinking? 0  3. How often do you have six or more drinks on one occasion? 0  AUDIT-C Score 1  Alcohol Brief Interventions/Follow-up -   A score of 3 or more in women, and 4 or more in men indicates increased risk for alcohol abuse, EXCEPT if all of the points are from question 1   No results found for any visits on 12/05/20.  Assessment & Plan    Routine Health Maintenance and Physical Exam  Exercise Activities and Dietary recommendations Goals     Exercise 150 minutes per week  (moderate activity)       Immunization History  Administered Date(s) Administered   Influenza,inj,Quad PF,6+ Mos 09/29/2013, 09/09/2015, 11/19/2016, 08/27/2017, 08/26/2018, 12/04/2019, 09/28/2020   PFIZER SARS-COV-2 Vaccination 02/10/2020, 03/02/2020, 09/28/2020   Tdap 11/26/2017    Health Maintenance  Topic Date Due   PAP SMEAR-Modifier  11/19/2021   MAMMOGRAM  01/07/2022   COLONOSCOPY (Pts 45-63yrs Insurance coverage will need to be confirmed)  12/30/2022   TETANUS/TDAP  11/27/2027   INFLUENZA  VACCINE  Completed   COVID-19 Vaccine  Completed   Hepatitis C Screening  Completed   HIV Screening  Completed    Discussed health benefits of physical activity, and encouraged her to engage in regular exercise appropriate for her age and condition.  Problem List Items Addressed This Visit      Cardiovascular and Mediastinum   Essential hypertension    Previously well controlled  149/88 today  Check home pressures, if elevated consider change in dose CMP today F/u 6 months      Relevant Orders   CBC with Differential/Platelet   Comprehensive metabolic panel   Hot flashes due to menopause    Clonidine is not helping with the hot flashes May be causing some rebound HTN D/c clonidine Start Gabapentin once nightly        Other   History of anemia   Relevant Orders   CBC with Differential/Platelet   Hyperlipidemia   Relevant Orders   Lipid Panel With LDL/HDL Ratio   Overweight   Relevant Orders   CBC with Differential/Platelet   Comprehensive metabolic panel   Lipid Panel With LDL/HDL Ratio   HgB A1c   Encounter for annual physical exam - Primary   Relevant Orders   CBC with Differential/Platelet   Comprehensive metabolic panel   Lipid Panel With LDL/HDL Ratio   HgB A1c   Hyperglycemia   Relevant Orders   HgB A1c       Return in about 6 months (around 06/04/2021) for chronic disease f/u.     Patient seen along with MS3 student [facility]. I  personally evaluated this patient along with the student, and verified all aspects of the history, physical exam, and medical decision making as documented by the student. I agree with the student's documentation and have made all necessary edits.  Khamora Karan, Marzella Schlein, MD, MPH [facility]

## 2020-12-05 NOTE — Assessment & Plan Note (Signed)
Previously well controlled  149/88 today  Check home pressures, if elevated consider change in dose CMP today F/u 6 months

## 2020-12-07 LAB — HEMOGLOBIN A1C
Est. average glucose Bld gHb Est-mCnc: 134 mg/dL
Hgb A1c MFr Bld: 6.3 % — ABNORMAL HIGH (ref 4.8–5.6)

## 2020-12-07 LAB — COMPREHENSIVE METABOLIC PANEL
ALT: 20 IU/L (ref 0–32)
AST: 22 IU/L (ref 0–40)
Albumin/Globulin Ratio: 1.4 (ref 1.2–2.2)
Albumin: 4.5 g/dL (ref 3.8–4.9)
Alkaline Phosphatase: 99 IU/L (ref 44–121)
BUN/Creatinine Ratio: 13 (ref 9–23)
BUN: 15 mg/dL (ref 6–24)
Bilirubin Total: 0.3 mg/dL (ref 0.0–1.2)
CO2: 28 mmol/L (ref 20–29)
Calcium: 10.4 mg/dL — ABNORMAL HIGH (ref 8.7–10.2)
Chloride: 100 mmol/L (ref 96–106)
Creatinine, Ser: 1.16 mg/dL — ABNORMAL HIGH (ref 0.57–1.00)
GFR calc Af Amer: 60 mL/min/{1.73_m2} (ref >59)
GFR calc non Af Amer: 52 mL/min/{1.73_m2} — ABNORMAL LOW (ref >59)
Globulin, Total: 3.2 g/dL (ref 1.5–4.5)
Glucose: 91 mg/dL (ref 65–99)
Potassium: 4.3 mmol/L (ref 3.5–5.2)
Sodium: 141 mmol/L (ref 134–144)
Total Protein: 7.7 g/dL (ref 6.0–8.5)

## 2020-12-07 LAB — LIPID PANEL WITH LDL/HDL RATIO
Cholesterol, Total: 263 mg/dL — ABNORMAL HIGH (ref 100–199)
HDL: 76 mg/dL (ref >39)
LDL Chol Calc (NIH): 168 mg/dL — ABNORMAL HIGH (ref 0–99)
LDL/HDL Ratio: 2.2 ratio (ref 0.0–3.2)
Triglycerides: 111 mg/dL (ref 0–149)
VLDL Cholesterol Cal: 19 mg/dL (ref 5–40)

## 2020-12-07 LAB — CBC WITH DIFFERENTIAL/PLATELET
Basophils Absolute: 0.1 10*3/uL (ref 0.0–0.2)
Basos: 1 %
EOS (ABSOLUTE): 0.2 10*3/uL (ref 0.0–0.4)
Eos: 3 %
Hematocrit: 37.6 % (ref 34.0–46.6)
Hemoglobin: 12.5 g/dL (ref 11.1–15.9)
Immature Grans (Abs): 0 10*3/uL (ref 0.0–0.1)
Immature Granulocytes: 0 %
Lymphocytes Absolute: 2.7 10*3/uL (ref 0.7–3.1)
Lymphs: 41 %
MCH: 31.1 pg (ref 26.6–33.0)
MCHC: 33.2 g/dL (ref 31.5–35.7)
MCV: 94 fL (ref 79–97)
Monocytes Absolute: 0.7 10*3/uL (ref 0.1–0.9)
Monocytes: 11 %
Neutrophils Absolute: 2.9 10*3/uL (ref 1.4–7.0)
Neutrophils: 44 %
Platelets: 242 10*3/uL (ref 150–450)
RBC: 4.02 x10E6/uL (ref 3.77–5.28)
RDW: 11.8 % (ref 11.7–15.4)
WBC: 6.6 10*3/uL (ref 3.4–10.8)

## 2020-12-13 ENCOUNTER — Telehealth: Payer: Self-pay

## 2020-12-13 DIAGNOSIS — E782 Mixed hyperlipidemia: Secondary | ICD-10-CM

## 2020-12-13 MED ORDER — ROSUVASTATIN CALCIUM 5 MG PO TABS: 5.0000 mg | ORAL_TABLET | Freq: Every day | ORAL | 3 refills | Status: DC

## 2020-12-13 NOTE — Telephone Encounter (Signed)
-----   Message from Erasmo Downer, MD sent at 12/10/2020 11:16 AM EST ----- Stable labs.  Hemoglobin A1c, 3 month avg of blood sugars, is in prediabetic range.  In order to prevent progression to diabetes, recommend low carb diet and regular exercise.  Cholesterol remains elevated.  The 10-year ASCVD (heart disease and stroke) risk score Denman George DC Jr., et al., 2013) is: 10.4%, which is high.  Recommend statin to lower that risk.  If patient agrees, would start Crestor 5 mg daily (okay to send prescription for #90 with 1 refill).  Would follow-up in 3 months and repeat labs.

## 2020-12-31 ENCOUNTER — Other Ambulatory Visit: Payer: Self-pay | Admitting: Family Medicine

## 2020-12-31 DIAGNOSIS — I10 Essential (primary) hypertension: Secondary | ICD-10-CM

## 2021-01-09 ENCOUNTER — Ambulatory Visit
Admission: RE | Admit: 2021-01-09 | Discharge: 2021-01-09 | Disposition: A | Discharge status: Home / Self Care | Payer: Managed Care, Other (non HMO) | Source: Ambulatory Visit | Attending: Family Medicine | Admitting: Family Medicine

## 2021-01-09 ENCOUNTER — Other Ambulatory Visit: Payer: Self-pay

## 2021-01-09 DIAGNOSIS — Z1231 Encounter for screening mammogram for malignant neoplasm of breast: Secondary | ICD-10-CM | POA: Insufficient documentation

## 2021-02-05 ENCOUNTER — Other Ambulatory Visit: Payer: Self-pay | Admitting: Family Medicine

## 2021-02-05 DIAGNOSIS — I10 Essential (primary) hypertension: Secondary | ICD-10-CM

## 2021-02-05 NOTE — Telephone Encounter (Signed)
   Notes to clinic: looks like medication was d/c on 12/05/2020  Review for refill    Requested Prescriptions  Pending Prescriptions Disp Refills   cloNIDine (CATAPRES) 0.1 MG tablet [Pharmacy Med Name: CLONIDINE HCL 0.1 MG TABLET] 90 tablet 1    Sig: TAKE 1 TABLET (0.1 MG TOTAL) BY MOUTH DAILY.      Cardiovascular:  Alpha-2 Agonists Failed - 02/05/2021 12:20 PM      Failed - Last BP in normal range    BP Readings from Last 1 Encounters:  12/05/20 (!) 149/88          Failed - Valid encounter within last 6 months    Recent Outpatient Visits           2 months ago Encounter for annual physical exam   [facility], Marzella Schlein, MD   7 months ago Essential hypertension   [facility] Osvaldo Angst M, [place]   8 months ago Essential hypertension   [facility], Lavella Hammock, [place]   1 year ago Rectal bleeding   [facility], Marzella Schlein, MD   1 year ago Encounter for annual physical exam   [facility], Marzella Schlein, MD       Future Appointments             In 1 month Bacigalupo, Marzella Schlein, MD [facility]   In 4 months Bacigalupo, Marzella Schlein, MD [facility]             Passed - Last Heart Rate in normal range    Pulse Readings from Last 1 Encounters:  12/05/20 73

## 2021-03-14 ENCOUNTER — Ambulatory Visit: Payer: Self-pay | Admitting: Family Medicine

## 2021-04-08 ENCOUNTER — Other Ambulatory Visit: Payer: Self-pay | Admitting: Family Medicine

## 2021-04-08 NOTE — Telephone Encounter (Signed)
Pt has F/U appt. 06/05/21

## 2021-04-22 ENCOUNTER — Ambulatory Visit: Payer: BC Managed Care – PPO | Admitting: Family Medicine

## 2021-04-22 ENCOUNTER — Encounter: Payer: Self-pay | Admitting: Family Medicine

## 2021-04-22 ENCOUNTER — Other Ambulatory Visit: Payer: Self-pay

## 2021-04-22 VITALS — BP 127/89 | HR 65 | Temp 98.2°F | Resp 16 | Ht 66.5 in | Wt 163.8 lb

## 2021-04-22 DIAGNOSIS — R7303 Prediabetes: Secondary | ICD-10-CM | POA: Diagnosis not present

## 2021-04-22 DIAGNOSIS — I10 Essential (primary) hypertension: Secondary | ICD-10-CM | POA: Diagnosis not present

## 2021-04-22 DIAGNOSIS — E782 Mixed hyperlipidemia: Secondary | ICD-10-CM

## 2021-04-22 DIAGNOSIS — E663 Overweight: Secondary | ICD-10-CM

## 2021-04-22 DIAGNOSIS — M25511 Pain in right shoulder: Secondary | ICD-10-CM

## 2021-04-22 DIAGNOSIS — Z23 Encounter for immunization: Secondary | ICD-10-CM

## 2021-04-22 LAB — POCT GLYCOSYLATED HEMOGLOBIN (HGB A1C)
Est. average glucose Bld gHb Est-mCnc: 134
Hemoglobin A1C: 6.3 % — AB (ref 4.0–5.6)

## 2021-04-22 NOTE — Assessment & Plan Note (Signed)
Previously uncontrolled Tolerating crestor well x26m  Recheck FLP and CMP

## 2021-04-22 NOTE — Assessment & Plan Note (Signed)
Well controlled Continue current medications Recheck metabolic panel F/u in 6 months  

## 2021-04-22 NOTE — Assessment & Plan Note (Signed)
New problem Benign exam Suspect sprain from overuse Discussed rest, ice/heat, range of motion exercises Discussed return precautions

## 2021-04-22 NOTE — Assessment & Plan Note (Signed)
Discussed low carb diet A1c unchanged

## 2021-04-22 NOTE — Progress Notes (Signed)
Established patient visit   Patient: Caroline Bowen   DOB: August 10, 1961   60 y.o. Female  MRN: [id number] Visit Date: 04/22/2021  Today's healthcare provider: Shirlee Latch, MD   Chief Complaint  Patient presents with  . Hyperlipidemia  . Prediabetes  . Hypertension   Subjective    Hyperlipidemia Pertinent negatives include no chest pain or shortness of breath.    She is reports she is doing fantastic.   Diabetes She questions the A1C range because she is disappointed that she had made a lot of changes and her A1C hasn't dropped since her last visit.   Weight loss In the mornings she typically eats 1 boiled egg and half grapefruit and she limits her intake of carbs. She consistently walks half a mile on treadmill and she does more when she can.  Medications She is taking crestor for her cholesterol and she reports it seems to be working well and denies any problems. She is no longer taking clonidine but she is still using maxzide for her hypertension.   Shoulder Pain She is having pain in her right shoulder. She has had these symptoms since Friday and she believes it could be from gardening using the weed-eater. She denies neck pain or numbness. Her symptoms don't worsen when doing any particular movements.   Vaccines She has not received the shingles vaccine because she has a new insurance but she's not sure if they will cover it. She does want to receive the vaccine.   Hypertension, follow-up  BP Readings from Last 3 Encounters:  04/22/21 127/89  12/05/20 (!) 149/88  06/19/20 (!) 132/84   Wt Readings from Last 3 Encounters:  04/22/21 163 lb 12.8 oz (74.3 kg)  12/05/20 175 lb (79.4 kg)  06/19/20 175 lb 3.2 oz (79.5 kg)     She was last seen for hypertension 3 months ago.  BP at that visit was 149/88. Management since that visit includes check home pressures, if elevated consider change in dose.  She reports excellent compliance with treatment. She is not  having side effects. She is following a Low fat, Low Sodium diet. She is exercising. She does not smoke.  Use of agents associated with hypertension: none.   Outside blood pressures are. Symptoms: No chest pain No chest pressure  No palpitations No syncope  No dyspnea No orthopnea  No paroxysmal nocturnal dyspnea No lower extremity edema   Pertinent labs: Lab Results  Component Value Date   CHOL 263 (H) 12/06/2020   HDL 76 12/06/2020   LDLCALC 168 (H) 12/06/2020   TRIG 111 12/06/2020   CHOLHDL 3.3 12/04/2019   Lab Results  Component Value Date   NA 141 12/06/2020   K 4.3 12/06/2020   CREATININE 1.16 (H) 12/06/2020   GFRNONAA 52 (L) 12/06/2020   GFRAA 60 12/06/2020   GLUCOSE 91 12/06/2020     The 10-year ASCVD risk score Denman George DC Jr., et al., 2013) is: 7.1%   --------------------------------------------------------------------------------------------------- Lipid/Cholesterol, Follow-up  Last lipid panel Other pertinent labs  Lab Results  Component Value Date   CHOL 263 (H) 12/06/2020   HDL 76 12/06/2020   LDLCALC 168 (H) 12/06/2020   TRIG 111 12/06/2020   CHOLHDL 3.3 12/04/2019   Lab Results  Component Value Date   ALT 20 12/06/2020   AST 22 12/06/2020   PLT 242 12/06/2020   TSH 2.430 06/06/2015     She was last seen for this 3 months ago.  Management since  that visit includes check lab. Cholesterol elevated, start Crestor 5 mg daily.  She reports excellent compliance with treatment. She is not having side effects.  Symptoms: No chest pain No chest pressure/discomfort  No dyspnea No lower extremity edema  No numbness or tingling of extremity No orthopnea  No palpitations No paroxysmal nocturnal dyspnea  No speech difficulty No syncope   Current diet: in general, a "healthy" diet   Current exercise: cardiovascular workout on exercise equipment  The 10-year ASCVD risk score Denman George(Goff DC Jr., et al., 2013) is:  7.1%  --------------------------------------------------------------------------------------------------- Prediabetes, Follow-up  Lab Results  Component Value Date   HGBA1C 6.3 (A) 04/22/2021   HGBA1C 6.3 (H) 12/06/2020   GLUCOSE 91 12/06/2020   GLUCOSE 99 05/17/2020   GLUCOSE 89 12/04/2019    Last seen for for this3 months ago.  Management since that visit includes check lab. Advised low carb diet and regular exercise. Current symptoms include none and have been improving.  Prior visit with dietician: no Current diet: in general, a "healthy" diet   Current exercise: cardiovascular workout on exercise equipment  Pertinent Labs:    Component Value Date/Time   CHOL 263 (H) 12/06/2020 0911   TRIG 111 12/06/2020 0911   CHOLHDL 3.3 12/04/2019 1105   CREATININE 1.16 (H) 12/06/2020 0911   CREATININE 1.22 06/17/2014 0034    Wt Readings from Last 3 Encounters:  04/22/21 163 lb 12.8 oz (74.3 kg)  12/05/20 175 lb (79.4 kg)  06/19/20 175 lb 3.2 oz (79.5 kg)    -----------------------------------------------------------------------------------------   Patient Active Problem List   Diagnosis Date Noted  . Acute pain of right shoulder 04/22/2021  . Hot flashes due to menopause 12/05/2020  . Hyperglycemia 12/05/2020  . Overweight 12/04/2019  . Hyperlipidemia 05/29/2019  . Menopausal disorder 06/05/2015  . History of anemia 05/24/2015  . Accumulation of fluid in tissues 05/24/2015  . Abnormal finding on breast imaging 04/16/2010  . Essential hypertension 09/06/2008  . Headache disorder 09/06/2008   Social History   Tobacco Use  . Smoking status: Never Smoker  . Smokeless tobacco: Never Used  Vaping Use  . Vaping Use: Never used  Substance Use Topics  . Alcohol use: No    Comment: very rare  . Drug use: No   Allergies  Allergen Reactions  . Metoprolol Swelling       Medications: Outpatient Medications Prior to Visit  Medication Sig  . gabapentin (NEURONTIN)  300 MG capsule TAKE 1 CAPSULE BY MOUTH THREE TIMES A DAY  . rosuvastatin (CRESTOR) 5 MG tablet Take 1 tablet (5 mg total) by mouth daily.  Marland Kitchen. triamterene-hydrochlorothiazide (MAXZIDE-25) 37.5-25 MG tablet TAKE 1 TABLET BY MOUTH EVERY DAY  . vitamin C (ASCORBIC ACID) 500 MG tablet Take 500 mg by mouth daily.  . [DISCONTINUED] cloNIDine (CATAPRES) 0.1 MG tablet TAKE 1 TABLET (0.1 MG TOTAL) BY MOUTH DAILY. (Patient not taking: Reported on 04/22/2021)   No facility-administered medications prior to visit.    Review of Systems  Constitutional: Negative for activity change, appetite change, chills, fatigue and fever.  HENT: Negative for ear pain, nosebleeds, sinus pressure, sinus pain and sore throat.   Eyes: Negative for pain.  Respiratory: Negative for cough, chest tightness, shortness of breath and wheezing.   Cardiovascular: Negative for chest pain, palpitations and leg swelling.  Gastrointestinal: Negative for abdominal pain, blood in stool, diarrhea, nausea and vomiting.  Genitourinary: Negative for flank pain, frequency, pelvic pain and urgency.  Musculoskeletal: Negative for back pain, neck pain  and neck stiffness.  Neurological: Negative for dizziness, syncope, weakness, light-headedness, numbness and headaches.       Objective    BP 127/89 (BP Location: Right Arm, Patient Position: Sitting, Cuff Size: Normal)   Pulse 65   Temp 98.2 F (36.8 C) (Oral)   Resp 16   Ht 5' 6.5" (1.689 m)   Wt 163 lb 12.8 oz (74.3 kg)   SpO2 100%   BMI 26.04 kg/m  BP Readings from Last 3 Encounters:  04/22/21 127/89  12/05/20 (!) 149/88  06/19/20 (!) 132/84   Wt Readings from Last 3 Encounters:  04/22/21 163 lb 12.8 oz (74.3 kg)  12/05/20 175 lb (79.4 kg)  06/19/20 175 lb 3.2 oz (79.5 kg)       Physical Exam Vitals reviewed.  Constitutional:      General: She is not in acute distress.    Appearance: Normal appearance. She is well-developed. She is not diaphoretic.  HENT:     Head:  Normocephalic and atraumatic.  Eyes:     General: No scleral icterus.    Conjunctiva/sclera: Conjunctivae normal.  Neck:     Thyroid: No thyromegaly.  Cardiovascular:     Rate and Rhythm: Normal rate and regular rhythm.     Pulses: Normal pulses.     Heart sounds: Normal heart sounds. No murmur heard.   Pulmonary:     Effort: Pulmonary effort is normal. No respiratory distress.     Breath sounds: Normal breath sounds. No wheezing, rhonchi or rales.  Musculoskeletal:        General: No swelling or tenderness. Normal range of motion.     Cervical back: Neck supple.     Right lower leg: No edema.     Left lower leg: No edema.     Comments: Negative Hawkins, + empty can  Lymphadenopathy:     Cervical: No cervical adenopathy.  Skin:    General: Skin is warm and dry.     Findings: No rash.  Neurological:     Mental Status: She is alert and oriented to person, place, and time. Mental status is at baseline.  Psychiatric:        Mood and Affect: Mood normal.        Behavior: Behavior normal.       Results for orders placed or performed in visit on 04/22/21  POCT glycosylated hemoglobin (Hb A1C)  Result Value Ref Range   Hemoglobin A1C 6.3 (A) 4.0 - 5.6 %   Est. average glucose Bld gHb Est-mCnc 134     Assessment & Plan     Problem List Items Addressed This Visit      Cardiovascular and Mediastinum   Essential hypertension - Primary    Well controlled Continue current medications Recheck metabolic panel F/u in 6 months       Relevant Orders   Comprehensive metabolic panel     Other   Hyperlipidemia    Previously uncontrolled Tolerating crestor well x13m  Recheck FLP and CMP      Relevant Orders   Comprehensive metabolic panel   Lipid panel   Overweight    Congratulated on weight loss Discussed importance of healthy weight management Discussed diet and exercise       Hyperglycemia    Discussed low carb diet A1c unchanged      Acute pain of right  shoulder    New problem Benign exam Suspect sprain from overuse Discussed rest, ice/heat, range of motion exercises Discussed return precautions  Other Visit Diagnoses    Need for shingles vaccine       Relevant Orders   Varicella-zoster vaccine IM (Completed)       Return in about 3 months (around 07/23/2021) for chronic disease f/u.       I,Essence Turner,acting as a Neurosurgeon for Shirlee Latch, MD.,have documented all relevant documentation on the behalf of Shirlee Latch, MD,as directed by  Shirlee Latch, MD while in the presence of Shirlee Latch, MD.  I, Shirlee Latch, MD, have reviewed all documentation for this visit. The documentation on 04/22/21 for the exam, diagnosis, procedures, and orders are all accurate and complete.   Christ Fullenwider, Marzella Schlein, MD, MPH [facility]

## 2021-04-22 NOTE — Patient Instructions (Signed)
High Cholesterol  High cholesterol is a condition in which the blood has high levels of a white, waxy substance similar to fat (cholesterol). The liver makes all the cholesterol that the body needs. The human body needs small amounts of cholesterol to help build cells. A person gets extra or excess cholesterol from the food that he or she eats. The blood carries cholesterol from the liver to the rest of the body. If you have high cholesterol, deposits (plaques) may build up on the walls of your arteries. Arteries are the blood vessels that carry blood away from your heart. These plaques make the arteries narrow and stiff. Cholesterol plaques increase your risk for heart attack and stroke. Work with your health care provider to keep your cholesterol levels in a healthy range. What increases the risk? The following factors may make you more likely to develop this condition:  Eating foods that are high in animal fat (saturated fat) or cholesterol.  Being overweight.  Not getting enough exercise.  A family history of high cholesterol (familial hypercholesterolemia).  Use of tobacco products.  Having diabetes. What are the signs or symptoms? There are no symptoms of this condition. How is this diagnosed? This condition may be diagnosed based on the results of a blood test.  If you are older than 60 years of age, your health care provider may check your cholesterol levels every 4-6 years.  You may be checked more often if you have high cholesterol or other risk factors for heart disease. The blood test for cholesterol measures:  "Bad" cholesterol, or LDL cholesterol. This is the main type of cholesterol that causes heart disease. The desired level is less than 100 mg/dL.  "Good" cholesterol, or HDL cholesterol. HDL helps protect against heart disease by cleaning the arteries and carrying the LDL to the liver for processing. The desired level for HDL is 60 mg/dL or higher.  Triglycerides.  These are fats that your body can store or burn for energy. The desired level is less than 150 mg/dL.  Total cholesterol. This measures the total amount of cholesterol in your blood and includes LDL, HDL, and triglycerides. The desired level is less than 200 mg/dL. How is this treated? This condition may be treated with:  Diet changes. You may be asked to eat foods that have more fiber and less saturated fats or added sugar.  Lifestyle changes. These may include regular exercise, maintaining a healthy weight, and quitting use of tobacco products.  Medicines. These are given when diet and lifestyle changes have not worked. You may be prescribed a statin medicine to help lower your cholesterol levels. Follow these instructions at home: Eating and drinking  Eat a healthy, balanced diet. This diet includes: ? Daily servings of a variety of fresh, frozen, or canned fruits and vegetables. ? Daily servings of whole grain foods that are rich in fiber. ? Foods that are low in saturated fats and trans fats. These include poultry and fish without skin, lean cuts of meat, and low-fat dairy products. ? A variety of fish, especially oily fish that contain omega-3 fatty acids. Aim to eat fish at least 2 times a week.  Avoid foods and drinks that have added sugar.  Use healthy cooking methods, such as roasting, grilling, broiling, baking, poaching, steaming, and stir-frying. Do not fry your food except for stir-frying.   Lifestyle  Get regular exercise. Aim to exercise for a total of 150 minutes a week. Increase your activity level by doing activities   such as gardening, walking, and taking the stairs.  Do not use any products that contain nicotine or tobacco, such as cigarettes, e-cigarettes, and chewing tobacco. If you need help quitting, ask your health care provider.   General instructions  Take over-the-counter and prescription medicines only as told by your health care provider.  Keep all  follow-up visits as told by your health care provider. This is important. Where to find more information  American Heart Association: www.heart.org  National Heart, Lung, and Blood Institute: www.nhlbi.nih.gov Contact a health care provider if:  You have trouble achieving or maintaining a healthy diet or weight.  You are starting an exercise program.  You are unable to stop smoking. Get help right away if:  You have chest pain.  You have trouble breathing.  You have any symptoms of a stroke. "BE FAST" is an easy way to remember the main warning signs of a stroke: ? B - Balance. Signs are dizziness, sudden trouble walking, or loss of balance. ? E - Eyes. Signs are trouble seeing or a sudden change in vision. ? F - Face. Signs are sudden weakness or numbness of the face, or the face or eyelid drooping on one side. ? A - Arms. Signs are weakness or numbness in an arm. This happens suddenly and usually on one side of the body. ? S - Speech. Signs are sudden trouble speaking, slurred speech, or trouble understanding what people say. ? T - Time. Time to call emergency services. Write down what time symptoms started.  You have other signs of a stroke, such as: ? A sudden, severe headache with no known cause. ? Nausea or vomiting. ? Seizure. These symptoms may represent a serious problem that is an emergency. Do not wait to see if the symptoms will go away. Get medical help right away. Call your local emergency services (911 in the [place] Do not drive yourself to the hospital. Summary  Cholesterol plaques increase your risk for heart attack and stroke. Work with your health care provider to keep your cholesterol levels in a healthy range.  Eat a healthy, balanced diet, get regular exercise, and maintain a healthy weight.  Do not use any products that contain nicotine or tobacco, such as cigarettes, e-cigarettes, and chewing tobacco.  Get help right away if you have any symptoms of a  stroke. This information is not intended to replace advice given to you by your health care provider. Make sure you discuss any questions you have with your health care provider. Document Revised: 10/09/2019 Document Reviewed: 10/09/2019 Elsevier Patient Education  2021 [employer] Inc.  

## 2021-04-22 NOTE — Assessment & Plan Note (Addendum)
Congratulated on weight loss ?Discussed importance of healthy weight management ?Discussed diet and exercise  ?

## 2021-04-23 LAB — LIPID PANEL
Chol/HDL Ratio: 2.1 ratio (ref 0.0–4.4)
Cholesterol, Total: 161 mg/dL (ref 100–199)
HDL: 75 mg/dL (ref >39)
LDL Chol Calc (NIH): 73 mg/dL (ref 0–99)
Triglycerides: 65 mg/dL (ref 0–149)
VLDL Cholesterol Cal: 13 mg/dL (ref 5–40)

## 2021-04-23 LAB — COMPREHENSIVE METABOLIC PANEL
ALT: 23 IU/L (ref 0–32)
AST: 21 IU/L (ref 0–40)
Albumin/Globulin Ratio: 1.5 (ref 1.2–2.2)
Albumin: 4.6 g/dL (ref 3.8–4.9)
Alkaline Phosphatase: 86 IU/L (ref 44–121)
BUN/Creatinine Ratio: 14 (ref 12–28)
BUN: 16 mg/dL (ref 8–27)
Bilirubin Total: 0.3 mg/dL (ref 0.0–1.2)
CO2: 24 mmol/L (ref 20–29)
Calcium: 10.4 mg/dL — ABNORMAL HIGH (ref 8.7–10.3)
Chloride: 105 mmol/L (ref 96–106)
Creatinine, Ser: 1.16 mg/dL — ABNORMAL HIGH (ref 0.57–1.00)
Globulin, Total: 3 g/dL (ref 1.5–4.5)
Glucose: 92 mg/dL (ref 65–99)
Potassium: 3.7 mmol/L (ref 3.5–5.2)
Sodium: 145 mmol/L — ABNORMAL HIGH (ref 134–144)
Total Protein: 7.6 g/dL (ref 6.0–8.5)
eGFR: 54 mL/min/{1.73_m2} — ABNORMAL LOW (ref >59)

## 2021-05-10 DIAGNOSIS — U071 COVID-19: Secondary | ICD-10-CM | POA: Diagnosis not present

## 2021-05-10 DIAGNOSIS — Z20822 Contact with and (suspected) exposure to covid-19: Secondary | ICD-10-CM | POA: Diagnosis not present

## 2021-05-17 DIAGNOSIS — M25511 Pain in right shoulder: Secondary | ICD-10-CM | POA: Diagnosis not present

## 2021-05-17 DIAGNOSIS — S43401A Unspecified sprain of right shoulder joint, initial encounter: Secondary | ICD-10-CM | POA: Diagnosis not present

## 2021-05-22 ENCOUNTER — Other Ambulatory Visit: Payer: Self-pay | Admitting: Family Medicine

## 2021-06-02 ENCOUNTER — Telehealth: Payer: Self-pay

## 2021-06-02 NOTE — Telephone Encounter (Signed)
Copied from CRM [id number]. Topic: Appointment Scheduling - Scheduling Inquiry for Clinic >> Jun 02, 2021  4:05 PM Laural Benes, [place] C wrote: Reason for CRM: pt called in for clarity. Pt say that she has 2 appointment scheduled. 7/14 and 8/30. Pt says that she has had a change in her insurance and would like to know if she could see provider for everything on 8/30 instead? Pt says that her new insurance would not have kicked in by 7/14 apt. Pt says if possible she would like to not be a self pay.   Please advise/ assist pt further.

## 2021-06-03 NOTE — Telephone Encounter (Signed)
Left detailed message on patients voice answering service advising her appt is not needed 7/14 and we will keep her scheduled appt in August. KW

## 2021-06-03 NOTE — Telephone Encounter (Signed)
I think the 7/14 is an error anyways. Ok to cancel it and I'll see her in August.

## 2021-06-05 ENCOUNTER — Ambulatory Visit: Payer: Self-pay | Admitting: Family Medicine

## 2021-07-15 ENCOUNTER — Other Ambulatory Visit: Payer: Self-pay | Admitting: Family Medicine

## 2021-07-15 DIAGNOSIS — I10 Essential (primary) hypertension: Secondary | ICD-10-CM

## 2021-07-22 ENCOUNTER — Other Ambulatory Visit: Payer: Self-pay

## 2021-07-22 ENCOUNTER — Ambulatory Visit (INDEPENDENT_AMBULATORY_CARE_PROVIDER_SITE_OTHER): Payer: BC Managed Care – PPO | Admitting: Family Medicine

## 2021-07-22 ENCOUNTER — Encounter: Payer: Self-pay | Admitting: Family Medicine

## 2021-07-22 VITALS — BP 158/95 | HR 58 | Temp 97.5°F | Resp 16 | Wt 161.7 lb

## 2021-07-22 DIAGNOSIS — E782 Mixed hyperlipidemia: Secondary | ICD-10-CM

## 2021-07-22 DIAGNOSIS — Z23 Encounter for immunization: Secondary | ICD-10-CM | POA: Diagnosis not present

## 2021-07-22 DIAGNOSIS — E663 Overweight: Secondary | ICD-10-CM

## 2021-07-22 DIAGNOSIS — R7303 Prediabetes: Secondary | ICD-10-CM | POA: Diagnosis not present

## 2021-07-22 DIAGNOSIS — I10 Essential (primary) hypertension: Secondary | ICD-10-CM

## 2021-07-22 LAB — POCT GLYCOSYLATED HEMOGLOBIN (HGB A1C): Hemoglobin A1C: 6.2 % — AB (ref 4.0–5.6)

## 2021-07-22 NOTE — Assessment & Plan Note (Signed)
Well controlled on last readings, no changes today

## 2021-07-22 NOTE — Assessment & Plan Note (Signed)
uncontrolled today, but she believes this is related to getting a vaccine today Continue current meds Reassess in 56m

## 2021-07-22 NOTE — Assessment & Plan Note (Signed)
A1c improving slightly Continue low carb diet

## 2021-07-22 NOTE — Assessment & Plan Note (Signed)
Congratulated on weight loss ?Discussed importance of healthy weight management ?Discussed diet and exercise  ?

## 2021-07-22 NOTE — Progress Notes (Signed)
Established patient visit   Patient: Caroline Bowen   DOB: [birth year]   60 y.o. Female  MRN: [id number] Visit Date: 07/22/2021  Today's healthcare provider: Shirlee Latch, MD   Chief Complaint  Patient presents with   Prediabetes   Hypertension   Hyperlipidemia   Subjective  -------------------------------------------------------------------------------------------------------------------- Hypertension Pertinent negatives include no chest pain, headaches, neck pain, palpitations or shortness of breath.  Hyperlipidemia Pertinent negatives include no chest pain, myalgias or shortness of breath.    Prediabetes, Follow-up  Lab Results  Component Value Date   HGBA1C 6.2 (A) 07/22/2021   HGBA1C 6.3 (A) 04/22/2021   HGBA1C 6.3 (H) 12/06/2020   GLUCOSE 92 04/22/2021   GLUCOSE 91 12/06/2020   GLUCOSE 99 05/17/2020    Last seen for for this 3 months ago.  Management since that visit includes no changes. Current symptoms include none and have been stable.  Prior visit with dietician: no Current diet: well balanced Current exercise: walking She walks 1-2 miles in the morning  Pertinent Labs:    Component Value Date/Time   CHOL 161 04/22/2021 0907   TRIG 65 04/22/2021 0907   CHOLHDL 2.1 04/22/2021 0907   CREATININE 1.16 (H) 04/22/2021 0907   CREATININE 1.22 06/17/2014 0034    Wt Readings from Last 3 Encounters:  07/22/21 161 lb 11.2 oz (73.3 kg)  04/22/21 163 lb 12.8 oz (74.3 kg)  12/05/20 175 lb (79.4 kg)    ----------------------------------------------------------------------------------------- Hypertension, follow-up  BP Readings from Last 3 Encounters:  07/22/21 (!) 158/95  04/22/21 127/89  12/05/20 (!) 149/88   Wt Readings from Last 3 Encounters:  07/22/21 161 lb 11.2 oz (73.3 kg)  04/22/21 163 lb 12.8 oz (74.3 kg)  12/05/20 175 lb (79.4 kg)     She was last seen for hypertension 3 months ago.  BP at that visit was 127/89. Management since  that visit includes no changes.  She reports excellent compliance with treatment. She is not having side effects.  She is following a Regular diet. She is exercising. She does not smoke.  Use of agents associated with hypertension: none.   Outside blood pressures are systolic 120-150 and diastolic >85. Symptoms: No chest pain No chest pressure  No palpitations No syncope  No dyspnea No orthopnea  No paroxysmal nocturnal dyspnea No lower extremity edema   Pertinent labs: Lab Results  Component Value Date   CHOL 161 04/22/2021   HDL 75 04/22/2021   LDLCALC 73 04/22/2021   TRIG 65 04/22/2021   CHOLHDL 2.1 04/22/2021   Lab Results  Component Value Date   NA 145 (H) 04/22/2021   K 3.7 04/22/2021   CREATININE 1.16 (H) 04/22/2021   GFRNONAA 52 (L) 12/06/2020   GFRAA 60 12/06/2020   GLUCOSE 92 04/22/2021     The 10-year ASCVD risk score Denman George DC Jr., et al., 2013) is: 8.5%   --------------------------------------------------------------------------------------------------- Lipid/Cholesterol, Follow-up  Last lipid panel Other pertinent labs  Lab Results  Component Value Date   CHOL 161 04/22/2021   HDL 75 04/22/2021   LDLCALC 73 04/22/2021   TRIG 65 04/22/2021   CHOLHDL 2.1 04/22/2021   Lab Results  Component Value Date   ALT 23 04/22/2021   AST 21 04/22/2021   PLT 242 12/06/2020   TSH 2.430 06/06/2015     She was last seen for this 3 months ago.  Management since that visit includes no changes.  She reports excellent compliance with treatment. She is not having side  effects.   Symptoms: No chest pain No chest pressure/discomfort  No dyspnea No lower extremity edema  No numbness or tingling of extremity No orthopnea  No palpitations No paroxysmal nocturnal dyspnea  No speech difficulty No syncope   Current diet: well balanced Current exercise: walking  The 10-year ASCVD risk score Denman George DC Jr., et al., 2013) is: 8.5%  Vaccine She will be receiving her  2nd shingrix vaccine today.   Her blood pressure is elevated today and she denies checking her blood pressure at home. However she does document when her blood pressure is checked and noticed fluctuation.  ---------------------------------------------------------------------------------------------------   Patient Active Problem List   Diagnosis Date Noted   Prediabetes 07/22/2021   Hot flashes due to menopause 12/05/2020   Overweight 12/04/2019   Hyperlipidemia 05/29/2019   Menopausal disorder 06/05/2015   History of anemia 05/24/2015   Abnormal finding on breast imaging 04/16/2010   Essential hypertension 09/06/2008   Headache disorder 09/06/2008   Social History   Tobacco Use   Smoking status: Never   Smokeless tobacco: Never  Vaping Use   Vaping Use: Never used  Substance Use Topics   Alcohol use: No    Comment: very rare   Drug use: No   Allergies  Allergen Reactions   Metoprolol Swelling      Medications: Outpatient Medications Prior to Visit  Medication Sig Note   gabapentin (NEURONTIN) 300 MG capsule TAKE 1 CAPSULE BY MOUTH THREE TIMES A DAY 07/22/2021: Patient reports that she is only taking one a day   rosuvastatin (CRESTOR) 5 MG tablet Take 1 tablet (5 mg total) by mouth daily.    triamterene-hydrochlorothiazide (MAXZIDE-25) 37.5-25 MG tablet TAKE 1 TABLET BY MOUTH EVERY DAY    vitamin C (ASCORBIC ACID) 500 MG tablet Take 500 mg by mouth daily.    No facility-administered medications prior to visit.    Review of Systems  Constitutional:  Positive for fever. Negative for activity change, appetite change, chills, fatigue and unexpected weight change.  HENT:  Negative for ear pain, sinus pressure, sinus pain and sore throat.   Eyes:  Negative for pain and visual disturbance.  Respiratory:  Negative for cough, chest tightness, shortness of breath and wheezing.   Cardiovascular:  Negative for chest pain, palpitations and leg swelling.  Gastrointestinal:   Negative for abdominal pain, blood in stool, diarrhea, nausea and vomiting.  Genitourinary:  Negative for flank pain, frequency, pelvic pain and urgency.  Musculoskeletal:  Negative for back pain, myalgias and neck pain.  Neurological:  Negative for dizziness, weakness, light-headedness, numbness and headaches.       Objective  -------------------------------------------------------------------------------------------------------------------- BP (!) 158/95   Pulse (!) 58   Temp (!) 97.5 F (36.4 C) (Oral)   Resp 16   Wt 161 lb 11.2 oz (73.3 kg)   SpO2 100%   BMI 25.71 kg/m  Physical Exam Vitals reviewed.  Constitutional:      General: She is not in acute distress.    Appearance: Normal appearance. She is well-developed. She is not diaphoretic.  HENT:     Head: Normocephalic and atraumatic.  Eyes:     General: No scleral icterus.    Conjunctiva/sclera: Conjunctivae normal.  Neck:     Thyroid: No thyromegaly.  Cardiovascular:     Rate and Rhythm: Normal rate and regular rhythm.     Pulses: Normal pulses.     Heart sounds: Normal heart sounds. No murmur heard. Pulmonary:     Effort: Pulmonary effort is  normal. No respiratory distress.     Breath sounds: Normal breath sounds. No wheezing, rhonchi or rales.  Musculoskeletal:     Cervical back: Neck supple.     Right lower leg: No edema.     Left lower leg: No edema.  Lymphadenopathy:     Cervical: No cervical adenopathy.  Skin:    General: Skin is warm and dry.     Findings: No rash.  Neurological:     Mental Status: She is alert and oriented to person, place, and time. Mental status is at baseline.  Psychiatric:        Mood and Affect: Mood normal.        Behavior: Behavior normal.      Results for orders placed or performed in visit on 07/22/21  POCT glycosylated hemoglobin (Hb A1C)  Result Value Ref Range   Hemoglobin A1C 6.2 (A) 4.0 - 5.6 %   HbA1c POC (<> result, manual entry)     HbA1c, POC (prediabetic  range)     HbA1c, POC (controlled diabetic range)      Assessment & Plan  ---------------------------------------------------------------------------------------------------------------------- Problem List Items Addressed This Visit       Cardiovascular and Mediastinum   Essential hypertension    uncontrolled today, but she believes this is related to getting a vaccine today Continue current meds Reassess in 52m        Other   Hyperlipidemia    Well controlled on last readings, no changes today      Overweight    Congratulated on weight loss Discussed importance of healthy weight management Discussed diet and exercise       Prediabetes - Primary    A1c improving slightly Continue low carb diet      Relevant Orders   POCT glycosylated hemoglobin (Hb A1C) (Completed)   Other Visit Diagnoses     Need for shingles vaccine       Relevant Orders   Varicella-zoster vaccine IM        Return in about 3 months (around 10/22/2021) for chronic disease f/u and 27m CPE.      I,Essence Turner,acting as a Neurosurgeon for Shirlee Latch, MD.,have documented all relevant documentation on the behalf of Shirlee Latch, MD,as directed by  Shirlee Latch, MD while in the presence of Shirlee Latch, MD.  I, Shirlee Latch, MD, have reviewed all documentation for this visit. The documentation on 07/22/21 for the exam, diagnosis, procedures, and orders are all accurate and complete.   Mikiyah Glasner, Marzella Schlein, MD, MPH [facility]

## 2021-09-05 ENCOUNTER — Other Ambulatory Visit: Payer: Self-pay | Admitting: Family Medicine

## 2021-09-05 DIAGNOSIS — E782 Mixed hyperlipidemia: Secondary | ICD-10-CM

## 2021-09-05 DIAGNOSIS — I10 Essential (primary) hypertension: Secondary | ICD-10-CM

## 2021-09-05 NOTE — Telephone Encounter (Signed)
Requested medications are due for refill today crestor early  Requested medications are on the active medication list yes  Last visit 07/22/21  Future visit scheduled 10/28/21  Notes to clinic Crestor early, labs abnormal, please assess. Requested Prescriptions  Pending Prescriptions Disp Refills   triamterene-hydrochlorothiazide (MAXZIDE-25) 37.5-25 MG tablet [Pharmacy Med Name: TRIAMTERENE-HCTZ 37.5-25 MG TB] 90 tablet 0    Sig: TAKE 1 TABLET BY MOUTH EVERY DAY     Cardiovascular: Diuretic Combos Failed - 09/05/2021 10:08 AM      Failed - Na in normal range and within 360 days    Sodium  Date Value Ref Range Status  04/22/2021 145 (H) 134 - 144 mmol/L Final  06/17/2014 140 136 - 145 mmol/L Final          Failed - Cr in normal range and within 360 days    Creatinine  Date Value Ref Range Status  06/17/2014 1.22 0.60 - 1.30 mg/dL Final   Creatinine, Ser  Date Value Ref Range Status  04/22/2021 1.16 (H) 0.57 - 1.00 mg/dL Final          Failed - Ca in normal range and within 360 days    Calcium  Date Value Ref Range Status  04/22/2021 10.4 (H) 8.7 - 10.3 mg/dL Final   Calcium, Total  Date Value Ref Range Status  06/17/2014 8.8 8.5 - 10.1 mg/dL Final          Failed - Last BP in normal range    BP Readings from Last 1 Encounters:  07/22/21 (!) 158/95          Passed - K in normal range and within 360 days    Potassium  Date Value Ref Range Status  04/22/2021 3.7 3.5 - 5.2 mmol/L Final  06/17/2014 3.6 3.5 - 5.1 mmol/L Final          Passed - Valid encounter within last 6 months    Recent Outpatient Visits           1 month ago Prediabetes   [facility] [place], Marzella Schlein, MD   4 months ago Essential hypertension   [facility] [place], Marzella Schlein, MD   9 months ago Encounter for annual physical exam   [facility] [place], Marzella Schlein, MD   1 year ago Essential hypertension   [facility]  [place]rey [place], [place]   1 year ago Essential hypertension   [facility] [place], Lavella [place], [place]       Future Appointments             In 1 month Bacigalupo, Marzella Schlein, MD [facility]   In 5 months Bacigalupo, Marzella Schlein, MD [facility]             gabapentin (NEURONTIN) 300 MG capsule [Pharmacy Med Name: GABAPENTIN 300 MG CAPSULE] 90 capsule 2    Sig: TAKE 1 CAPSULE BY MOUTH THREE TIMES A DAY     Neurology: Anticonvulsants - gabapentin Passed - 09/05/2021 10:08 AM      Passed - Valid encounter within last 12 months    Recent Outpatient Visits           1 month ago Prediabetes   [facility] [place], Marzella Schlein, MD   4 months ago Essential hypertension   [facility] [place], Marzella Schlein, MD   9 months ago Encounter for annual physical exam   [facility] Erasmo Downer,  [place]   1 year ago Essential hypertension   [facility] [place], Lavella [place], [place]   1 year ago Essential hypertension   [facility] Trey [place], [place]       Future Appointments             In 1 month Bacigalupo, Marzella Schlein, MD [facility]   In 5 months Bacigalupo, Marzella Schlein, MD [facility], [facility]             rosuvastatin (CRESTOR) 5 MG tablet [employer] Med Name: ROSUVASTATIN CALCIUM 5 MG TAB] 90 tablet 3    Sig: TAKE 1 TABLET (5 MG TOTAL) BY MOUTH DAILY.     Cardiovascular:  Antilipid - Statins Passed - 09/05/2021 10:08 AM      Passed - Total Cholesterol in normal range and within 360 days    Cholesterol, Total  Date Value Ref Range Status  04/22/2021 161 100 - 199 mg/dL Final          Passed - LDL in normal range and within 360 days    LDL Chol Calc (NIH)  Date Value Ref Range Status  04/22/2021 73 0 - 99 mg/dL Final          Passed - HDL in normal range and within 360 days    HDL  Date Value Ref Range Status   04/22/2021 75 >39 mg/dL Final          Passed - Triglycerides in normal range and within 360 days    Triglycerides  Date Value Ref Range Status  04/22/2021 65 0 - 149 mg/dL Final          Passed - Patient is not pregnant      Passed - Valid encounter within last 12 months    Recent Outpatient Visits           1 month ago Prediabetes   [facility], Marzella Schlein, [place]   4 months ago Essential hypertension   [facility] [place], Marzella Schlein, MD   9 months ago Encounter for annual physical exam   [facility] [place], Marzella Schlein, MD   1 year ago Essential hypertension   [facility] [place], [place]   1 year ago Essential hypertension   [facility] [place], Lavella Hammock, [place]       Future Appointments             In 1 month Bacigalupo, Marzella Schlein, MD [facility], [facility]   In 5 months Bacigalupo, Marzella Schlein, MD [facility], [facility]

## 2021-09-09 ENCOUNTER — Other Ambulatory Visit: Payer: Self-pay | Admitting: Family Medicine

## 2021-10-08 ENCOUNTER — Other Ambulatory Visit: Payer: Self-pay | Admitting: Family Medicine

## 2021-10-08 DIAGNOSIS — E782 Mixed hyperlipidemia: Secondary | ICD-10-CM

## 2021-10-08 DIAGNOSIS — I10 Essential (primary) hypertension: Secondary | ICD-10-CM

## 2021-10-28 ENCOUNTER — Other Ambulatory Visit: Payer: Self-pay

## 2021-10-28 ENCOUNTER — Ambulatory Visit (INDEPENDENT_AMBULATORY_CARE_PROVIDER_SITE_OTHER): Payer: BC Managed Care – PPO | Admitting: Family Medicine

## 2021-10-28 ENCOUNTER — Encounter: Payer: Self-pay | Admitting: Family Medicine

## 2021-10-28 VITALS — BP 128/82 | HR 72 | Temp 98.7°F | Resp 16 | Ht 66.0 in | Wt 162.9 lb

## 2021-10-28 DIAGNOSIS — R7303 Prediabetes: Secondary | ICD-10-CM | POA: Diagnosis not present

## 2021-10-28 DIAGNOSIS — I10 Essential (primary) hypertension: Secondary | ICD-10-CM | POA: Diagnosis not present

## 2021-10-28 DIAGNOSIS — E663 Overweight: Secondary | ICD-10-CM | POA: Diagnosis not present

## 2021-10-28 NOTE — Progress Notes (Signed)
Established patient visit   Patient: Caroline Bowen   DOB: Dec 10, 1960   60 y.o. Female  MRN: [id number] Visit Date: 10/28/2021  Today's healthcare provider: Shirlee Latch, MD   Chief Complaint  Patient presents with   Follow-up    HTN   Subjective    HPI HPI     Follow-up    Additional comments: HTN      Last edited by Marjie Skiff, CMA on 10/28/2021  8:29 AM.      Prediabetes - Walking 2 miles a day, 5 days a week - Feeling confident about diet and exercise changes  Hypertension - Only takes home BP when she has headaches - Last time was on Oct 8 - 156/90  Health maintenance - Covid booster scheduled on Friday at [facility]  Menopause - Experiences vasomotor symptoms daily, but does not cause significant distress  Medications: Outpatient Medications Prior to Visit  Medication Sig   gabapentin (NEURONTIN) 300 MG capsule TAKE 1 CAPSULE BY MOUTH THREE TIMES A DAY   rosuvastatin (CRESTOR) 5 MG tablet Take 1 tablet (5 mg total) by mouth daily.   triamterene-hydrochlorothiazide (MAXZIDE-25) 37.5-25 MG tablet TAKE 1 TABLET BY MOUTH EVERY DAY   vitamin C (ASCORBIC ACID) 500 MG tablet Take 500 mg by mouth daily.   No facility-administered medications prior to visit.    Review of Systems  Constitutional: Negative.   HENT: Negative.    Eyes: Negative.   Respiratory: Negative.    Cardiovascular: Negative.   Gastrointestinal: Negative.   Endocrine: Positive for heat intolerance.  Genitourinary: Negative.   Musculoskeletal: Negative.   Skin: Negative.   Neurological:  Positive for headaches.  Psychiatric/Behavioral: Negative.        Objective    BP 128/82 (BP Location: Right Arm, Patient Position: Sitting, Cuff Size: Large)   Pulse 72   Temp 98.7 F (37.1 C) (Oral)   Resp 16   Ht 5\' 6"  (1.676 m)   Wt 162 lb 14.4 oz (73.9 kg)   SpO2 99%   BMI 26.29 kg/m  {Show previous vital signs (optional):23777}  Physical Exam Vitals reviewed.   Constitutional:      General: She is not in acute distress.    Appearance: Normal appearance. She is not ill-appearing or toxic-appearing.  HENT:     Head: Normocephalic and atraumatic.     Right Ear: External ear normal.     Left Ear: External ear normal.     Nose: Nose normal.     Mouth/Throat:     Mouth: Mucous membranes are moist.     Pharynx: Oropharynx is clear. No oropharyngeal exudate or posterior oropharyngeal erythema.  Eyes:     General: No scleral icterus.    Extraocular Movements: Extraocular movements intact.     Conjunctiva/sclera: Conjunctivae normal.     Pupils: Pupils are equal, round, and reactive to light.  Cardiovascular:     Rate and Rhythm: Normal rate and regular rhythm.     Pulses: Normal pulses.     Heart sounds: Normal heart sounds. No murmur heard.   No friction rub. No gallop.  Pulmonary:     Effort: Pulmonary effort is normal. No respiratory distress.     Breath sounds: Normal breath sounds. No wheezing or rhonchi.  Chest:     Chest wall: No tenderness.  Abdominal:     General: Abdomen is flat. There is no distension.     Palpations: Abdomen is soft.     Tenderness:  There is no abdominal tenderness.  Musculoskeletal:        General: Normal range of motion.     Cervical back: Normal range of motion and neck supple.     Right lower leg: No edema.     Left lower leg: No edema.  Skin:    General: Skin is warm and dry.     Capillary Refill: Capillary refill takes less than 2 seconds.     Findings: No lesion or rash.  Neurological:     General: No focal deficit present.     Mental Status: She is alert and oriented to person, place, and time. Mental status is at baseline.  Psychiatric:        Mood and Affect: Mood normal.     No results found for any visits on 10/28/21.  Assessment & Plan     Problem List Items Addressed This Visit       Cardiovascular and Mediastinum   Essential hypertension - Primary    Well controlled Continue  Maxzide-25 Recheck BMP F/u in 3 months      Relevant Orders   Basic Metabolic Panel (BMET)     Other   Overweight    Discussed importance of healthy weight management Discussed diet and exercise      Prediabetes    Continue low carb diet Recheck A1c      Relevant Orders   Hemoglobin A1c     Return in about 3 months (around 01/26/2022) for CPE, as scheduled.      Gwyndolyn Kaufman, Medical Student 10/28/2021, 9:21 AM   Patient seen along with MS3 student Gwyndolyn Kaufman. I personally evaluated this patient along with the student, and verified all aspects of the history, physical exam, and medical decision making as documented by the student. I agree with the student's documentation and have made all necessary edits.  Vernie Vinciguerra, Marzella Schlein, MD, MPH [facility]

## 2021-10-28 NOTE — Assessment & Plan Note (Signed)
Discussed importance of healthy weight management Discussed diet and exercise  

## 2021-10-28 NOTE — Assessment & Plan Note (Signed)
Well controlled Continue Maxzide-25 Recheck BMP F/u in 3 months

## 2021-10-28 NOTE — Assessment & Plan Note (Signed)
Continue low carb diet Recheck A1c 

## 2021-10-29 LAB — BASIC METABOLIC PANEL
BUN/Creatinine Ratio: 14 (ref 12–28)
BUN: 15 mg/dL (ref 8–27)
CO2: 28 mmol/L (ref 20–29)
Calcium: 10.1 mg/dL (ref 8.7–10.3)
Chloride: 102 mmol/L (ref 96–106)
Creatinine, Ser: 1.1 mg/dL — ABNORMAL HIGH (ref 0.57–1.00)
Glucose: 90 mg/dL (ref 70–99)
Potassium: 4.4 mmol/L (ref 3.5–5.2)
Sodium: 143 mmol/L (ref 134–144)
eGFR: 58 mL/min/{1.73_m2} — ABNORMAL LOW (ref >59)

## 2021-10-29 LAB — HEMOGLOBIN A1C
Est. average glucose Bld gHb Est-mCnc: 131 mg/dL
Hgb A1c MFr Bld: 6.2 % — ABNORMAL HIGH (ref 4.8–5.6)

## 2021-11-29 ENCOUNTER — Other Ambulatory Visit: Payer: Self-pay | Admitting: Family Medicine

## 2021-11-29 DIAGNOSIS — E782 Mixed hyperlipidemia: Secondary | ICD-10-CM

## 2021-11-29 DIAGNOSIS — I10 Essential (primary) hypertension: Secondary | ICD-10-CM

## 2021-11-29 NOTE — Telephone Encounter (Signed)
Requested Prescriptions  Pending Prescriptions Disp Refills   triamterene-hydrochlorothiazide (MAXZIDE-25) 37.5-25 MG tablet [Pharmacy Med Name: TRIAMTERENE-HCTZ 37.5-25 MG TB] 90 tablet 0    Sig: TAKE 1 TABLET BY MOUTH EVERY DAY     Cardiovascular: Diuretic Combos Failed - 11/29/2021 11:06 AM      Failed - Cr in normal range and within 360 days    Creatinine  Date Value Ref Range Status  06/17/2014 1.22 0.60 - 1.30 mg/dL Final   Creatinine, Ser  Date Value Ref Range Status  10/28/2021 1.10 (H) 0.57 - 1.00 mg/dL Final         Passed - K in normal range and within 360 days    Potassium  Date Value Ref Range Status  10/28/2021 4.4 3.5 - 5.2 mmol/L Final  06/17/2014 3.6 3.5 - 5.1 mmol/L Final         Passed - Na in normal range and within 360 days    Sodium  Date Value Ref Range Status  10/28/2021 143 134 - 144 mmol/L Final  06/17/2014 140 136 - 145 mmol/L Final         Passed - Ca in normal range and within 360 days    Calcium  Date Value Ref Range Status  10/28/2021 10.1 8.7 - 10.3 mg/dL Final   Calcium, Total  Date Value Ref Range Status  06/17/2014 8.8 8.5 - 10.1 mg/dL Final         Passed - Last BP in normal range    BP Readings from Last 1 Encounters:  10/28/21 128/82         Passed - Valid encounter within last 6 months    Recent Outpatient Visits          1 month ago Essential hypertension   [facility] [place], Marzella Schlein, [place]   4 months ago Prediabetes   [facility] [place], Marzella Schlein, [place]   7 months ago Essential hypertension   [facility] [place], Marzella Schlein, [place]   11 months ago Encounter for annual physical exam   [facility] [place], Marzella Schlein, [place]   1 year ago Essential hypertension   [facility] [place], [place]      Future Appointments            In 2 months Bacigalupo, Marzella Schlein, MD [facility]            rosuvastatin (CRESTOR) 5 MG  tablet [Pharmacy Med Name: ROSUVASTATIN CALCIUM 5 MG TAB] 90 tablet 1    Sig: TAKE 1 TABLET (5 MG TOTAL) BY MOUTH DAILY.     Cardiovascular:  Antilipid - Statins Passed - 11/29/2021 11:06 AM      Passed - Total Cholesterol in normal range and within 360 days    Cholesterol, Total  Date Value Ref Range Status  04/22/2021 161 100 - 199 mg/dL Final         Passed - LDL in normal range and within 360 days    LDL Chol Calc (NIH)  Date Value Ref Range Status  04/22/2021 73 0 - 99 mg/dL Final         Passed - HDL in normal range and within 360 days    HDL  Date Value Ref Range Status  04/22/2021 75 >39 mg/dL Final         Passed - Triglycerides in normal range and within 360 days    Triglycerides  Date Value Ref Range Status  04/22/2021 65 0 - 149 mg/dL Final         Passed - Patient is not pregnant      Passed - Valid encounter within last 12 months    Recent Outpatient Visits          1 month ago Essential hypertension   [facility] [place], Marzella Schlein, [place]   4 months ago Prediabetes   [facility] [place], Marzella Schlein, MD   7 months ago Essential hypertension   [facility] [place], Marzella Schlein, MD   11 months ago Encounter for annual physical exam   [id number], Marzella Schlein, [place]   1 year ago Essential hypertension   [facility] [place], [place]      Future Appointments            In 2 months Bacigalupo, Marzella Schlein, MD [facility], [facility]

## 2021-12-02 ENCOUNTER — Other Ambulatory Visit: Payer: Self-pay | Admitting: Family Medicine

## 2021-12-02 DIAGNOSIS — Z1231 Encounter for screening mammogram for malignant neoplasm of breast: Secondary | ICD-10-CM

## 2022-01-12 ENCOUNTER — Other Ambulatory Visit: Payer: Self-pay

## 2022-01-12 ENCOUNTER — Ambulatory Visit
Admission: RE | Admit: 2022-01-12 | Discharge: 2022-01-12 | Disposition: A | Discharge status: Home / Self Care | Payer: BC Managed Care – PPO | Source: Ambulatory Visit | Attending: Family Medicine | Admitting: Family Medicine

## 2022-01-12 DIAGNOSIS — Z1231 Encounter for screening mammogram for malignant neoplasm of breast: Secondary | ICD-10-CM | POA: Insufficient documentation

## 2022-02-04 NOTE — Progress Notes (Signed)
? ? ? ?Complete physical exam ? ? ?Patient: Caroline Bowen   DOB: [birth year]   61 y.o. Female  MRN: [id number] ?Visit Date: 02/05/2022 ? ?Today's healthcare provider: Lavon Paganini, MD  ? ?Chief Complaint  ?Patient presents with  ? Annual Exam  ? ?Subjective  ?  ?Caroline Bowen is a 61 y.o. female who presents today for a complete physical exam.  ?She reports consuming a general diet. Exercise is limited by walking 2 mls per day x 4 days per wk. She generally feels well. She reports sleeping fairly well. She does not have additional problems to discuss today.  ? ?HPI  ? ?Mammogram done:01/12/2022 ? ?Past Medical History:  ?Diagnosis Date  ? Anemia   ? Hypertension   ? ?Past Surgical History:  ?Procedure Laterality Date  ? CARDIAC CATHETERIZATION  06/18/2016  ? Normal coronaries. Normal EF=60%. Dr. Humphrey Rolls  ? CYST EXCISION  2001  ? wrist  ? wrist surgery  2001  ? ?Social History  ? ?Socioeconomic History  ? Marital status: Married  ?  Spouse name: Cristie Hem  ? Number of children: 1  ? Years of education: 44  ? Highest education level: Bachelor's degree (e.g., BA, AB, BS)  ?Occupational History  ? Not on file  ?Tobacco Use  ? Smoking status: Never  ? Smokeless tobacco: Never  ?Vaping Use  ? Vaping Use: Never used  ?Substance and Sexual Activity  ? Alcohol use: No  ?  Comment: very rare  ? Drug use: No  ? Sexual activity: Yes  ?  Birth control/protection: Post-menopausal  ?Other Topics Concern  ? Not on file  ?Social History Narrative  ? Not on file  ? ?Social Determinants of Health  ? ?Financial Resource Strain: Not on file  ?Food Insecurity: Not on file  ?Transportation Needs: Not on file  ?Physical Activity: Not on file  ?Stress: Not on file  ?Social Connections: Not on file  ?Intimate Partner Violence: Not on file  ? ?Family Status  ?Relation Name Status  ? Mother  Alive  ? Father  Deceased  ? Sister  Alive  ? Brother  Alive  ? Sister  Alive  ? Brother  Deceased  ? Brother  Alive  ? Neg Hx  (Not Specified)  ? ?Family  History  ?Problem Relation Age of Onset  ? Hypertension Mother   ? Cancer Mother   ?     ovarian  ? Ovarian cancer Mother   ? Hypertension Father   ? Diabetes Father   ? Kidney disease Father   ? Prostate cancer Father   ? Heart failure Father   ? Diabetes Sister   ? Hypertension Sister   ? Hypertension Brother   ? Stroke Brother   ? Hypertension Sister   ? Pancreatic cancer Brother   ? Hypertension Brother   ? Breast cancer Neg Hx   ? Colon cancer Neg Hx   ? ?Allergies  ?Allergen Reactions  ? Metoprolol Swelling  ?  ?Patient Care Team: ?Virginia Crews, MD as PCP - General (Family Medicine)  ? ?Medications: ?Outpatient Medications Prior to Visit  ?Medication Sig  ? gabapentin (NEURONTIN) 300 MG capsule TAKE 1 CAPSULE BY MOUTH THREE TIMES A DAY  ? rosuvastatin (CRESTOR) 5 MG tablet TAKE 1 TABLET (5 MG TOTAL) BY MOUTH DAILY.  ? triamterene-hydrochlorothiazide (MAXZIDE-25) 37.5-25 MG tablet TAKE 1 TABLET BY MOUTH EVERY DAY  ? vitamin C (ASCORBIC ACID) 500 MG tablet Take 500 mg by mouth daily.  ? ?  No facility-administered medications prior to visit.  ? ? ?Review of Systems  ?All other systems reviewed and are negative. ? ? ? Objective  ?  ?BP (!) 140/96 (BP Location: Left Arm, Cuff Size: Normal)   Pulse (!) 59   Temp 98.1 ?F (36.7 ?C) (Oral)   Ht 5\' 6"  (1.676 m)   Wt 167 lb (75.8 kg)   SpO2 100%   BMI 26.95 kg/m?  ? ?  ?Vitals:  ? 02/05/22 1024 02/05/22 1031  ?BP: (!) 152/90 (!) 140/96  ?Pulse: (!) 59   ?Temp: 98.1 ?F (36.7 ?C)   ?Height: 5\' 6"  (1.676 m)   ?Weight: 167 lb (75.8 kg)   ?SpO2: 100%   ?TempSrc: Oral   ?BMI (Calculated): 26.97   ?  ? ?Physical Exam ?Vitals reviewed.  ?Constitutional:   ?   General: She is not in acute distress. ?   Appearance: Normal appearance. She is well-developed. She is not diaphoretic.  ?HENT:  ?   Head: Normocephalic and atraumatic.  ?   Right Ear: Tympanic membrane, ear canal and external ear normal.  ?   Left Ear: Tympanic membrane, ear canal and external ear normal.  ?    Nose: Nose normal.  ?   Mouth/Throat:  ?   Mouth: Mucous membranes are moist.  ?   Pharynx: Oropharynx is clear. No oropharyngeal exudate.  ?Eyes:  ?   General: No scleral icterus. ?   Conjunctiva/sclera: Conjunctivae normal.  ?   Pupils: Pupils are equal, round, and reactive to light.  ?Neck:  ?   Thyroid: No thyromegaly.  ?Cardiovascular:  ?   Rate and Rhythm: Normal rate and regular rhythm.  ?   Heart sounds: Normal heart sounds. No murmur heard. ?Pulmonary:  ?   Effort: Pulmonary effort is normal. No respiratory distress.  ?   Breath sounds: Normal breath sounds. No wheezing or rales.  ?Abdominal:  ?   General: There is no distension.  ?   Palpations: Abdomen is soft.  ?   Tenderness: There is no abdominal tenderness.  ?Musculoskeletal:     ?   General: No deformity.  ?   Cervical back: Neck supple.  ?   Right lower leg: No edema.  ?   Left lower leg: No edema.  ?Lymphadenopathy:  ?   Cervical: No cervical adenopathy.  ?Skin: ?   General: Skin is warm and dry.  ?   Findings: No rash.  ?Neurological:  ?   Mental Status: She is alert and oriented to person, place, and time. Mental status is at baseline.  ?   Gait: Gait normal.  ?Psychiatric:     ?   Mood and Affect: Mood normal.     ?   Behavior: Behavior normal.     ?   Thought Content: Thought content normal.  ?  ? ? ?Last depression screening scores ?PHQ 2/9 Scores 02/05/2022 10/28/2021 12/05/2020  ?PHQ - 2 Score 0 0 0  ?PHQ- 9 Score 0 - 2  ? ?Last fall risk screening ?Fall Risk  02/05/2022  ?Falls in the past year? 0  ?Number falls in past yr: 0  ?Injury with Fall? 0  ?Risk for fall due to : No Fall Risks  ?Follow up Falls evaluation completed  ? ?Last Audit-C alcohol use screening ?Alcohol Use Disorder Test (AUDIT) 02/05/2022  ?1. How often do you have a drink containing alcohol? 1  ?2. How many drinks containing alcohol do you have on a typical day  when you are drinking? 0  ?3. How often do you have six or more drinks on one occasion? 0  ?AUDIT-C Score 1   ?Alcohol Brief Interventions/Follow-up -  ? ?A score of 3 or more in women, and 4 or more in men indicates increased risk for alcohol abuse, EXCEPT if all of the points are from question 1  ? ?No results found for any visits on 02/05/22. ? Assessment & Plan  ?  ?Routine Health Maintenance and Physical Exam ? ?Exercise Activities and Dietary recommendations ? Goals   ? ?  Exercise 150 minutes per week (moderate activity)   ? ?  ? ? ?Immunization History  ?Administered Date(s) Administered  ? Influenza,inj,Quad PF,6+ Mos 09/29/2013, 09/09/2015, 11/19/2016, 08/27/2017, 08/26/2018, 12/04/2019, 09/28/2020, 10/03/2021  ? PFIZER(Purple Top)SARS-COV-2 Vaccination 02/10/2020, 03/02/2020, 09/28/2020  ? Pension scheme manager 4yrs & up 11/29/2021  ? Tdap 11/26/2017  ? Zoster Recombinat (Shingrix) 04/22/2021, 07/22/2021  ? ? ?Health Maintenance  ?Topic Date Due  ? PAP SMEAR-Modifier  11/19/2021  ? COLONOSCOPY (Pts 45-55yrs Insurance coverage will need to be confirmed)  12/30/2022  ? MAMMOGRAM  01/13/2024  ? TETANUS/TDAP  11/27/2027  ? INFLUENZA VACCINE  Completed  ? COVID-19 Vaccine  Completed  ? Hepatitis C Screening  Completed  ? HIV Screening  Completed  ? Zoster Vaccines- Shingrix  Completed  ? HPV VACCINES  Aged Out  ? ? ?Discussed health benefits of physical activity, and encouraged her to engage in regular exercise appropriate for her age and condition. ? ?Problem List Items Addressed This Visit   ? ?  ? Cardiovascular and Mediastinum  ? Essential hypertension  ?  Elevated today ?Typically well controlled ?She attributes this to a stressful morning ?No changes to meds ?Monitor home BPs ?F/u in 70m ?  ?  ? Relevant Orders  ? Comprehensive metabolic panel  ?  ? Other  ? Hyperlipidemia  ?  Previously well controlled ?Continue statin ?Repeat FLP and CMP ?  ?  ? Relevant Orders  ? Comprehensive metabolic panel  ? Lipid panel  ? Overweight  ?  Discussed importance of healthy weight management ?Discussed diet  and exercise  ?  ?  ? Prediabetes  ?  Recommend low carb diet ?Recheck A1c  ?  ?  ? Relevant Orders  ? Hemoglobin A1c  ? ?Other Visit Diagnoses   ? ? Encounter for annual physical exam    -  Primary  ? Relevant Or

## 2022-02-05 ENCOUNTER — Other Ambulatory Visit: Payer: Self-pay

## 2022-02-05 ENCOUNTER — Other Ambulatory Visit (HOSPITAL_COMMUNITY)
Admission: RE | Admit: 2022-02-05 | Discharge: 2022-02-05 | Disposition: A | Discharge status: Home / Self Care | Payer: BC Managed Care – PPO | Source: Ambulatory Visit | Attending: Family Medicine | Admitting: Family Medicine

## 2022-02-05 ENCOUNTER — Ambulatory Visit (INDEPENDENT_AMBULATORY_CARE_PROVIDER_SITE_OTHER): Payer: BC Managed Care – PPO | Admitting: Family Medicine

## 2022-02-05 ENCOUNTER — Encounter: Payer: Self-pay | Admitting: Family Medicine

## 2022-02-05 VITALS — BP 140/96 | HR 59 | Temp 98.1°F | Ht 66.0 in | Wt 167.0 lb

## 2022-02-05 DIAGNOSIS — E782 Mixed hyperlipidemia: Secondary | ICD-10-CM | POA: Diagnosis not present

## 2022-02-05 DIAGNOSIS — R7303 Prediabetes: Secondary | ICD-10-CM | POA: Diagnosis not present

## 2022-02-05 DIAGNOSIS — Z0000 Encounter for general adult medical examination without abnormal findings: Secondary | ICD-10-CM

## 2022-02-05 DIAGNOSIS — E663 Overweight: Secondary | ICD-10-CM

## 2022-02-05 DIAGNOSIS — I10 Essential (primary) hypertension: Secondary | ICD-10-CM | POA: Diagnosis not present

## 2022-02-05 DIAGNOSIS — Z124 Encounter for screening for malignant neoplasm of cervix: Secondary | ICD-10-CM | POA: Insufficient documentation

## 2022-02-05 NOTE — Assessment & Plan Note (Signed)
Elevated today ?Typically well controlled ?She attributes this to a stressful morning ?No changes to meds ?Monitor home BPs ?F/u in 17m ?

## 2022-02-05 NOTE — Assessment & Plan Note (Signed)
Recommend low carb diet °Recheck A1c  °

## 2022-02-05 NOTE — Assessment & Plan Note (Signed)
Previously well controlled Continue statin Repeat FLP and CMP  

## 2022-02-05 NOTE — Assessment & Plan Note (Signed)
Discussed importance of healthy weight management Discussed diet and exercise  

## 2022-02-06 LAB — COMPREHENSIVE METABOLIC PANEL
ALT: 19 IU/L (ref 0–32)
AST: 21 IU/L (ref 0–40)
Albumin/Globulin Ratio: 1.5 (ref 1.2–2.2)
Albumin: 4.7 g/dL (ref 3.8–4.9)
Alkaline Phosphatase: 97 IU/L (ref 44–121)
BUN/Creatinine Ratio: 15 (ref 12–28)
BUN: 15 mg/dL (ref 8–27)
Bilirubin Total: 0.3 mg/dL (ref 0.0–1.2)
CO2: 29 mmol/L (ref 20–29)
Calcium: 10.2 mg/dL (ref 8.7–10.3)
Chloride: 101 mmol/L (ref 96–106)
Creatinine, Ser: 1.01 mg/dL — ABNORMAL HIGH (ref 0.57–1.00)
Globulin, Total: 3.1 g/dL (ref 1.5–4.5)
Glucose: 92 mg/dL (ref 70–99)
Potassium: 4.4 mmol/L (ref 3.5–5.2)
Sodium: 142 mmol/L (ref 134–144)
Total Protein: 7.8 g/dL (ref 6.0–8.5)
eGFR: 64 mL/min/{1.73_m2} (ref >59)

## 2022-02-06 LAB — LIPID PANEL
Chol/HDL Ratio: 2.1 ratio (ref 0.0–4.4)
Cholesterol, Total: 183 mg/dL (ref 100–199)
HDL: 86 mg/dL (ref >39)
LDL Chol Calc (NIH): 86 mg/dL (ref 0–99)
Triglycerides: 60 mg/dL (ref 0–149)
VLDL Cholesterol Cal: 11 mg/dL (ref 5–40)

## 2022-02-06 LAB — HEMOGLOBIN A1C
Est. average glucose Bld gHb Est-mCnc: 126 mg/dL
Hgb A1c MFr Bld: 6 % — ABNORMAL HIGH (ref 4.8–5.6)

## 2022-02-09 LAB — CYTOLOGY - PAP
Adequacy: ABSENT
Comment: NEGATIVE
Diagnosis: NEGATIVE
High risk HPV: NEGATIVE

## 2022-02-21 ENCOUNTER — Other Ambulatory Visit: Payer: Self-pay | Admitting: Family Medicine

## 2022-02-21 DIAGNOSIS — I10 Essential (primary) hypertension: Secondary | ICD-10-CM

## 2022-05-05 NOTE — Progress Notes (Signed)
Established patient visit   Patient: Caroline Bowen   DOB: [birth year]   61 y.o. Female  MRN: [id number] Visit Date: 05/07/2022  Today's healthcare provider: Lavon Paganini, MD   Chief Complaint  Patient presents with   Follow-up   Subjective    HPI  Hypertension, follow-up  BP Readings from Last 3 Encounters:  05/07/22 136/80  02/05/22 (!) 140/96  10/28/21 128/82   Wt Readings from Last 3 Encounters:  05/07/22 170 lb 9.6 oz (77.4 kg)  02/05/22 167 lb (75.8 kg)  10/28/21 162 lb 14.4 oz (73.9 kg)     She was last seen for hypertension 3 months ago.  BP at that visit was 140/96. Management since that visit includes no changes to meds.  She reports excellent compliance with treatment. She is not having side effects.  She is following a Regular diet. She is exercising. She does not smoke.  Use of agents associated with hypertension: none.   Outside blood pressures are not being checked. Symptoms: No chest pain No chest pressure  No palpitations No syncope  No dyspnea No orthopnea  No paroxysmal nocturnal dyspnea No lower extremity edema   Pertinent labs Lab Results  Component Value Date   CHOL 183 02/05/2022   HDL 86 02/05/2022   LDLCALC 86 02/05/2022   TRIG 60 02/05/2022   CHOLHDL 2.1 02/05/2022   Lab Results  Component Value Date   NA 142 02/05/2022   K 4.4 02/05/2022   CREATININE 1.01 (H) 02/05/2022   EGFR 64 02/05/2022   GLUCOSE 92 02/05/2022   TSH 2.430 06/06/2015     The 10-year ASCVD risk score (Arnett DK, et al., 2019) is: 6.3%  ---------------------------------------------------------------------------------------------------   Medications: Outpatient Medications Prior to Visit  Medication Sig   gabapentin (NEURONTIN) 300 MG capsule TAKE 1 CAPSULE BY MOUTH THREE TIMES A DAY   rosuvastatin (CRESTOR) 5 MG tablet TAKE 1 TABLET (5 MG TOTAL) BY MOUTH DAILY.   triamterene-hydrochlorothiazide (MAXZIDE-25) 37.5-25 MG tablet TAKE 1 TABLET  BY MOUTH EVERY DAY   vitamin C (ASCORBIC ACID) 500 MG tablet Take 500 mg by mouth daily.   No facility-administered medications prior to visit.    Review of Systems  Constitutional:  Negative for appetite change, chills, fatigue and fever.  Respiratory:  Negative for chest tightness and shortness of breath.   Cardiovascular:  Negative for chest pain and palpitations.  Gastrointestinal:  Negative for abdominal pain, nausea and vomiting.  Neurological:  Negative for dizziness and weakness.       Objective    BP 136/80 (BP Location: Left Arm, Patient Position: Sitting, Cuff Size: Normal)   Pulse 65   Temp 98.1 F (36.7 C) (Oral)   Resp 16   Ht 5' 6.5" (1.689 m)   Wt 170 lb 9.6 oz (77.4 kg)   BMI 27.12 kg/m    Physical Exam Vitals reviewed.  Constitutional:      General: She is not in acute distress.    Appearance: Normal appearance. She is well-developed. She is not diaphoretic.  HENT:     Head: Normocephalic and atraumatic.  Eyes:     General: No scleral icterus.    Conjunctiva/sclera: Conjunctivae normal.  Neck:     Thyroid: No thyromegaly.  Cardiovascular:     Rate and Rhythm: Normal rate and regular rhythm.     Heart sounds: Normal heart sounds. No murmur heard. Pulmonary:     Effort: Pulmonary effort is normal. No respiratory distress.  Breath sounds: Normal breath sounds. No wheezing, rhonchi or rales.  Musculoskeletal:     Cervical back: Neck supple.     Right lower leg: No edema.     Left lower leg: No edema.  Lymphadenopathy:     Cervical: No cervical adenopathy.  Skin:    General: Skin is warm and dry.  Neurological:     Mental Status: She is alert and oriented to person, place, and time. Mental status is at baseline.  Psychiatric:        Mood and Affect: Mood normal.        Behavior: Behavior normal.       No results found for any visits on 05/07/22.  Assessment & Plan     Problem List Items Addressed This Visit       Cardiovascular and  Mediastinum   Essential hypertension - Primary    Well controlled Continue current medications Reviewed recent metabolic panel F/u in 3 months         Return in about 3 months (around 08/07/2022) for chronic disease f/u.      I, Lavon Paganini, MD, have reviewed all documentation for this visit. The documentation on 05/07/22 for the exam, diagnosis, procedures, and orders are all accurate and complete.   Gwynn Chalker, Dionne Bucy, MD, MPH [facility]

## 2022-05-07 ENCOUNTER — Ambulatory Visit (INDEPENDENT_AMBULATORY_CARE_PROVIDER_SITE_OTHER): Payer: BC Managed Care – PPO | Admitting: Family Medicine

## 2022-05-07 ENCOUNTER — Encounter: Payer: Self-pay | Admitting: Family Medicine

## 2022-05-07 VITALS — BP 136/80 | HR 65 | Temp 98.1°F | Resp 16 | Ht 66.5 in | Wt 170.6 lb

## 2022-05-07 DIAGNOSIS — Z0000 Encounter for general adult medical examination without abnormal findings: Secondary | ICD-10-CM

## 2022-05-07 DIAGNOSIS — I10 Essential (primary) hypertension: Secondary | ICD-10-CM

## 2022-05-07 NOTE — Assessment & Plan Note (Signed)
Well controlled ?Continue current medications ?Reviewed recent metabolic panel ?F/u in 3 months  ?

## 2022-05-20 ENCOUNTER — Other Ambulatory Visit: Payer: Self-pay | Admitting: Family Medicine

## 2022-05-20 DIAGNOSIS — I10 Essential (primary) hypertension: Secondary | ICD-10-CM

## 2022-05-20 DIAGNOSIS — E782 Mixed hyperlipidemia: Secondary | ICD-10-CM

## 2022-08-18 ENCOUNTER — Other Ambulatory Visit: Payer: Self-pay | Admitting: Family Medicine

## 2022-09-04 NOTE — Progress Notes (Unsigned)
I,Caroline Bowen,acting as a Education administrator for Lavon Paganini, MD.,have documented all relevant documentation on the behalf of Lavon Paganini, MD,as directed by  Lavon Paganini, MD while in the presence of Lavon Paganini, MD.    Established patient visit   Patient: Caroline Bowen   DOB: September 06, 1961   61 y.o. Female  MRN: [id number] Visit Date: 09/07/2022  Today's healthcare provider: Lavon Paganini, MD   No chief complaint on file.  Subjective    HPI  Hypertension, follow-up  BP Readings from Last 3 Encounters:  05/07/22 136/80  02/05/22 (!) 140/96  10/28/21 128/82   Wt Readings from Last 3 Encounters:  05/07/22 170 lb 9.6 oz (77.4 kg)  02/05/22 167 lb (75.8 kg)  10/28/21 162 lb 14.4 oz (73.9 kg)     She was last seen for hypertension 4 months ago.  BP at that visit was 136/80. Management since that visit includes no chagnes.  She reports {excellent/good/fair/poor:19665} compliance with treatment. She {is/is not:9024} having side effects. {document side effects if present:1} She is following a {diet:21022986} diet. She {is/is not:9024} exercising. She {does/does not:200015} smoke.  Use of agents associated with hypertension: {bp agents assoc with hypertension:511::"none"}.   Outside blood pressures are {***enter patient reported home BP readings, or 'not being checked':1}. Symptoms: {Yes/No:20286} chest pain {Yes/No:20286} chest pressure  {Yes/No:20286} palpitations {Yes/No:20286} syncope  {Yes/No:20286} dyspnea {Yes/No:20286} orthopnea  {Yes/No:20286} paroxysmal nocturnal dyspnea {Yes/No:20286} lower extremity edema   Pertinent labs Lab Results  Component Value Date   CHOL 183 02/05/2022   HDL 86 02/05/2022   LDLCALC 86 02/05/2022   TRIG 60 02/05/2022   CHOLHDL 2.1 02/05/2022   Lab Results  Component Value Date   NA 142 02/05/2022   K 4.4 02/05/2022   CREATININE 1.01 (H) 02/05/2022   EGFR 64 02/05/2022   GLUCOSE 92 02/05/2022   TSH 2.430  06/06/2015     The 10-year ASCVD risk score (Arnett DK, et al., 2019) is: 6.3%  --------------------------------------------------------------------------------------------------- Prediabetes, Follow-up  Lab Results  Component Value Date   HGBA1C 6.0 (H) 02/05/2022   HGBA1C 6.2 (H) 10/28/2021   HGBA1C 6.2 (A) 07/22/2021   GLUCOSE 92 02/05/2022   GLUCOSE 90 10/28/2021   GLUCOSE 92 04/22/2021    Last seen for for this6 months ago.  Management since that visit includes no changes. Current symptoms include {Symptoms; diabetes:14075} and have been {Desc; course:15616}.   Pertinent Labs:    Component Value Date/Time   CHOL 183 02/05/2022 1120   TRIG 60 02/05/2022 1120   CHOLHDL 2.1 02/05/2022 1120   CREATININE 1.01 (H) 02/05/2022 1120   CREATININE 1.22 06/17/2014 0034    Wt Readings from Last 3 Encounters:  05/07/22 170 lb 9.6 oz (77.4 kg)  02/05/22 167 lb (75.8 kg)  10/28/21 162 lb 14.4 oz (73.9 kg)   ----------------------------------------------------------------------------------------- Lipid/Cholesterol, Follow-up  Last lipid panel Other pertinent labs  Lab Results  Component Value Date   CHOL 183 02/05/2022   HDL 86 02/05/2022   LDLCALC 86 02/05/2022   TRIG 60 02/05/2022   CHOLHDL 2.1 02/05/2022   Lab Results  Component Value Date   ALT 19 02/05/2022   AST 21 02/05/2022   PLT 242 12/06/2020   TSH 2.430 06/06/2015     She was last seen for this 6 months ago.  Management since that visit includes no changes.  She reports {excellent/good/fair/poor:19665} compliance with treatment. She {is/is not:9024} having side effects. {document side effects if present:1}  Current exercise: {exercise types:16438}  The 10-year  ASCVD risk score (Arnett DK, et al., 2019) is: 6.3%  ---------------------------------------------------------------------------------------------------   Medications: Outpatient Medications Prior to Visit  Medication Sig   gabapentin  (NEURONTIN) 300 MG capsule TAKE 1 CAPSULE BY MOUTH THREE TIMES A DAY   rosuvastatin (CRESTOR) 5 MG tablet TAKE 1 TABLET (5 MG TOTAL) BY MOUTH DAILY.   triamterene-hydrochlorothiazide (MAXZIDE-25) 37.5-25 MG tablet TAKE 1 TABLET BY MOUTH EVERY DAY   vitamin C (ASCORBIC ACID) 500 MG tablet Take 500 mg by mouth daily.   No facility-administered medications prior to visit.    Review of Systems  Constitutional:  Negative for appetite change, chills, fatigue and fever.  Respiratory:  Negative for chest tightness and shortness of breath.   Cardiovascular:  Negative for chest pain and palpitations.  Gastrointestinal:  Negative for abdominal pain, nausea and vomiting.  Neurological:  Negative for dizziness and weakness.    {Labs  Heme  Chem  Endocrine  Serology  Results Review (optional):23779}   Objective    There were no vitals taken for this visit. BP Readings from Last 3 Encounters:  05/07/22 136/80  02/05/22 (!) 140/96  10/28/21 128/82   Wt Readings from Last 3 Encounters:  05/07/22 170 lb 9.6 oz (77.4 kg)  02/05/22 167 lb (75.8 kg)  10/28/21 162 lb 14.4 oz (73.9 kg)      Physical Exam  ***  No results found for any visits on 09/07/22.  Assessment & Plan     ***  No follow-ups on file.      {provider attestation***:1}   Lavon Paganini, MD  [facility] [phone] (phone) [phone] (fax)  [place]

## 2022-09-07 ENCOUNTER — Encounter: Payer: Self-pay | Admitting: Family Medicine

## 2022-09-07 ENCOUNTER — Ambulatory Visit (INDEPENDENT_AMBULATORY_CARE_PROVIDER_SITE_OTHER): Payer: BC Managed Care – PPO | Admitting: Family Medicine

## 2022-09-07 VITALS — BP 126/81 | HR 60 | Temp 98.6°F | Resp 16 | Ht 66.0 in | Wt 167.5 lb

## 2022-09-07 DIAGNOSIS — E782 Mixed hyperlipidemia: Secondary | ICD-10-CM

## 2022-09-07 DIAGNOSIS — I10 Essential (primary) hypertension: Secondary | ICD-10-CM | POA: Diagnosis not present

## 2022-09-07 DIAGNOSIS — R7303 Prediabetes: Secondary | ICD-10-CM

## 2022-09-07 DIAGNOSIS — Z23 Encounter for immunization: Secondary | ICD-10-CM

## 2022-09-07 DIAGNOSIS — E663 Overweight: Secondary | ICD-10-CM

## 2022-09-07 NOTE — Assessment & Plan Note (Signed)
Previously well controlled Continue statin Repeat FLP and CMP  

## 2022-09-07 NOTE — Assessment & Plan Note (Signed)
Recommend low carb diet °Recheck A1c  °

## 2022-09-07 NOTE — Assessment & Plan Note (Signed)
Well controlled Continue current medications Recheck metabolic panel F/u in 6 months  

## 2022-09-07 NOTE — Assessment & Plan Note (Signed)
Discussed importance of healthy weight management Discussed diet and exercise  

## 2022-09-08 LAB — COMPREHENSIVE METABOLIC PANEL
ALT: 16 IU/L (ref 0–32)
AST: 23 IU/L (ref 0–40)
Albumin/Globulin Ratio: 1.8 (ref 1.2–2.2)
Albumin: 4.6 g/dL (ref 3.9–4.9)
Alkaline Phosphatase: 91 IU/L (ref 44–121)
BUN/Creatinine Ratio: 11 — ABNORMAL LOW (ref 12–28)
BUN: 13 mg/dL (ref 8–27)
Bilirubin Total: 0.4 mg/dL (ref 0.0–1.2)
CO2: 27 mmol/L (ref 20–29)
Calcium: 10 mg/dL (ref 8.7–10.3)
Chloride: 103 mmol/L (ref 96–106)
Creatinine, Ser: 1.2 mg/dL — ABNORMAL HIGH (ref 0.57–1.00)
Globulin, Total: 2.5 g/dL (ref 1.5–4.5)
Glucose: 90 mg/dL (ref 70–99)
Potassium: 4 mmol/L (ref 3.5–5.2)
Sodium: 144 mmol/L (ref 134–144)
Total Protein: 7.1 g/dL (ref 6.0–8.5)
eGFR: 51 mL/min/{1.73_m2} — ABNORMAL LOW (ref >59)

## 2022-09-08 LAB — HEMOGLOBIN A1C
Est. average glucose Bld gHb Est-mCnc: 134 mg/dL
Hgb A1c MFr Bld: 6.3 % — ABNORMAL HIGH (ref 4.8–5.6)

## 2022-09-08 LAB — LIPID PANEL WITH LDL/HDL RATIO
Cholesterol, Total: 170 mg/dL (ref 100–199)
HDL: 78 mg/dL (ref >39)
LDL Chol Calc (NIH): 79 mg/dL (ref 0–99)
LDL/HDL Ratio: 1 ratio (ref 0.0–3.2)
Triglycerides: 66 mg/dL (ref 0–149)
VLDL Cholesterol Cal: 13 mg/dL (ref 5–40)

## 2022-10-22 ENCOUNTER — Encounter: Payer: Self-pay | Admitting: Physician Assistant

## 2022-10-22 ENCOUNTER — Ambulatory Visit (INDEPENDENT_AMBULATORY_CARE_PROVIDER_SITE_OTHER): Payer: BC Managed Care – PPO | Admitting: Physician Assistant

## 2022-10-22 VITALS — BP 137/77 | HR 97 | Temp 100.2°F | Ht 65.0 in | Wt 164.1 lb

## 2022-10-22 DIAGNOSIS — R0989 Other specified symptoms and signs involving the circulatory and respiratory systems: Secondary | ICD-10-CM | POA: Diagnosis not present

## 2022-10-22 DIAGNOSIS — R059 Cough, unspecified: Secondary | ICD-10-CM

## 2022-10-22 DIAGNOSIS — J329 Chronic sinusitis, unspecified: Secondary | ICD-10-CM

## 2022-10-22 DIAGNOSIS — R6883 Chills (without fever): Secondary | ICD-10-CM | POA: Diagnosis not present

## 2022-10-22 LAB — POC COVID19 BINAXNOW: SARS Coronavirus 2 Ag: NEGATIVE

## 2022-10-22 LAB — POCT INFLUENZA A/B
Influenza A, POC: NEGATIVE
Influenza B, POC: NEGATIVE

## 2022-10-22 MED ORDER — FLUTICASONE PROPIONATE 50 MCG/ACT NA SUSP: 2.0000 | Freq: Every day | NASAL | 6 refills | Status: DC

## 2022-10-22 NOTE — Progress Notes (Signed)
I,Sha'taria Tyson,acting as a Neurosurgeon for [employer], PA-C.,have documented all relevant documentation on the behalf of Debera Lat, PA-C,as directed by  [employer], PA-C while in the presence of [employer], PA-C.   Established patient visit   Patient: Caroline Bowen   DOB: [birth year]   61 y.o. Female  MRN: [id number] Visit Date: 10/22/2022  Today's healthcare provider: Debera Lat, PA-C   No chief complaint on file.  Subjective    HPI   Started 3 days ago Symptoms: Chills, cough, headaches, nasal congestion and when coughing a little rattle in her chest, low energy level Patient took a tylenol last night but not much more No contact to patient knowledge with anyone sick  Medications: Outpatient Medications Prior to Visit  Medication Sig   gabapentin (NEURONTIN) 300 MG capsule TAKE 1 CAPSULE BY MOUTH THREE TIMES A DAY   rosuvastatin (CRESTOR) 5 MG tablet TAKE 1 TABLET (5 MG TOTAL) BY MOUTH DAILY.   triamterene-hydrochlorothiazide (MAXZIDE-25) 37.5-25 MG tablet TAKE 1 TABLET BY MOUTH EVERY DAY   vitamin C (ASCORBIC ACID) 500 MG tablet Take 500 mg by mouth daily.   No facility-administered medications prior to visit.    Review of Systems  All other systems reviewed and are negative. Except see HPI     Objective    There were no vitals taken for this visit.   Physical Exam Vitals reviewed.  Constitutional:      General: She is not in acute distress.    Appearance: Normal appearance. She is well-developed. She is not diaphoretic.  HENT:     Head: Normocephalic and atraumatic.     Right Ear: There is impacted cerumen (partially).     Left Ear: There is impacted cerumen (partially).     Nose: Congestion and rhinorrhea present.     Mouth/Throat:     Pharynx: Posterior oropharyngeal erythema (mild) present.     Comments: Postnasal drainage  Eyes:     General: No scleral icterus.       Right eye: No discharge.        Left eye: No discharge.      Extraocular Movements: Extraocular movements intact.     Conjunctiva/sclera: Conjunctivae normal.     Pupils: Pupils are equal, round, and reactive to light.  Neck:     Thyroid: No thyromegaly.  Cardiovascular:     Rate and Rhythm: Normal rate and regular rhythm.     Pulses: Normal pulses.     Heart sounds: Normal heart sounds. No murmur heard. Pulmonary:     Effort: Pulmonary effort is normal. No respiratory distress.     Breath sounds: Normal breath sounds. No wheezing, rhonchi or rales.  Musculoskeletal:        General: Normal range of motion.     Cervical back: Normal range of motion and neck supple.     Right lower leg: No edema.     Left lower leg: No edema.  Lymphadenopathy:     Cervical: No cervical adenopathy.  Skin:    General: Skin is warm and dry.     Findings: No rash.  Neurological:     Mental Status: She is alert and oriented to person, place, and time. Mental status is at baseline.  Psychiatric:        Mood and Affect: Mood normal.        Behavior: Behavior normal.        Thought Content: Thought content normal.  Judgment: Judgment normal.      No results found for any visits on 10/22/22.  Assessment & Plan     1. Rhinosinusitis - symptoms and exam c/w sinusitis   - no evidence of AOM, CAP, strep pharyngitis, or other infection - given duration of symptoms, suspect viral etiology - discussed symptomatic management (flonase, decongestants, etc), natural course, and return precautions   - Increase fluids.  Rest.  Saline nasal spray.  Mucinex as directed.  Humidifier in bedroom. Flonase per orders.  Continue tylenol with ibuprofen as needed. Call or return to clinic if symptoms are not improving.   - fluticasone (FLONASE) 50 MCG/ACT nasal spray; Place 2 sprays into both nostrils daily.  Dispense: 16 g; Refill: 6  2. Symptoms of upper respiratory infection (URI) Most likely due to acute viral rhinosinusitis - POC COVID-19 negative  - POCT Influenza A/B  negative Treatment as above The patient was advised to call back or seek an in-person evaluation if the symptoms worsen or if the condition fails to improve as anticipated.  I discussed the assessment and treatment plan with the patient. The patient was provided an opportunity to ask questions and all were answered. The patient agreed with the plan and demonstrated an understanding of the instructions.  The entirety of the information documented in the History of Present Illness, Review of Systems and Physical Exam were personally obtained by me. Portions of this information were initially documented by the CMA and reviewed by me for thoroughness and accuracy.   Debera Lat, [facility], MMS [facility] [phone] (phone) [phone] (fax)

## 2022-11-19 ENCOUNTER — Other Ambulatory Visit: Payer: Self-pay | Admitting: Family Medicine

## 2022-11-19 DIAGNOSIS — I10 Essential (primary) hypertension: Secondary | ICD-10-CM

## 2022-11-26 ENCOUNTER — Other Ambulatory Visit: Payer: Self-pay | Admitting: Family Medicine

## 2022-11-26 DIAGNOSIS — E782 Mixed hyperlipidemia: Secondary | ICD-10-CM

## 2022-12-14 ENCOUNTER — Other Ambulatory Visit: Payer: Self-pay | Admitting: Family Medicine

## 2022-12-14 DIAGNOSIS — Z1231 Encounter for screening mammogram for malignant neoplasm of breast: Secondary | ICD-10-CM

## 2023-01-13 ENCOUNTER — Ambulatory Visit
Admission: RE | Admit: 2023-01-13 | Discharge: 2023-01-13 | Disposition: A | Discharge status: Home / Self Care | Payer: BC Managed Care – PPO | Source: Ambulatory Visit | Attending: Family Medicine | Admitting: Family Medicine

## 2023-01-13 DIAGNOSIS — Z1231 Encounter for screening mammogram for malignant neoplasm of breast: Secondary | ICD-10-CM | POA: Diagnosis not present

## 2023-03-08 NOTE — Progress Notes (Unsigned)
I,Bobette Leyh S Derra Shartzer,acting as a Neurosurgeon for Shirlee Latch, MD.,have documented all relevant documentation on the behalf of Shirlee Latch, MD,as directed by  Shirlee Latch, MD while in the presence of Shirlee Latch, MD.    Complete physical exam   Patient: Caroline Bowen   DOB: 11-Nov-1961   62 y.o. Female  MRN: [id number] Visit Date: 03/09/2023  Today's healthcare provider: Shirlee Latch, MD   Chief Complaint  Patient presents with   Annual Exam   Subjective    Caroline Bowen is a 62 y.o. female who presents today for a complete physical exam.  She reports consuming a general diet. She walks about 2.5 miles every morning  She generally feels well. She reports sleeping well. She does not have additional problems to discuss today.   HPI   Colonoscopy- patient wants - referral placed  Past Medical History:  Diagnosis Date   Anemia    Hypertension    Past Surgical History:  Procedure Laterality Date   CARDIAC CATHETERIZATION  06/18/2016   Normal coronaries. Normal EF=60%. Dr. Welton Flakes   CYST EXCISION  2001   wrist   wrist surgery  2001   Social History   Socioeconomic History   Marital status: Married    Spouse name: Trinna Post   Number of children: 1   Years of education: 16   Highest education level: Bachelor's degree (e.g., BA, AB, BS)  Occupational History   Not on file  Tobacco Use   Smoking status: Never   Smokeless tobacco: Never  Vaping Use   Vaping Use: Never used  Substance and Sexual Activity   Alcohol use: No    Comment: very rare   Drug use: No   Sexual activity: Yes    Birth control/protection: Post-menopausal  Other Topics Concern   Not on file  Social History Narrative   Not on file   Social Determinants of Health   Financial Resource Strain: Low Risk  (11/26/2017)   Overall Financial Resource Strain (CARDIA)    Difficulty of Paying Living Expenses: Not hard at all  Food Insecurity: No Food Insecurity (11/26/2017)   Hunger Vital  Sign    Worried About Running Out of Food in the Last Year: Never true    Ran Out of Food in the Last Year: Never true  Transportation Needs: No Transportation Needs (11/26/2017)   PRAPARE - Administrator, Civil Service (Medical): No    Lack of Transportation (Non-Medical): No  Physical Activity: Insufficiently Active (11/26/2017)   Exercise Vital Sign    Days of Exercise per Week: 3 days    Minutes of Exercise per Session: 20 min  Stress: Not on file  Social Connections: Not on file  Intimate Partner Violence: Not on file   Family Status  Relation Name Status   Mother  Alive   Father  Deceased   Sister  Alive   Brother  Alive   Sister  Alive   Brother  Deceased   Brother  Alive   Neg Hx  (Not Specified)   Family History  Problem Relation Age of Onset   Hypertension Mother    Cancer Mother        ovarian   Ovarian cancer Mother    Hypertension Father    Diabetes Father    Kidney disease Father    Prostate cancer Father    Heart failure Father    Diabetes Sister    Hypertension Sister    Hypertension Brother  Stroke Brother    Hypertension Sister    Pancreatic cancer Brother    Hypertension Brother    Breast cancer Neg Hx    Colon cancer Neg Hx    Allergies  Allergen Reactions   Metoprolol Swelling    Patient Care Team: Erasmo Downer, MD as PCP - General (Family Medicine)   Medications: Outpatient Medications Prior to Visit  Medication Sig   gabapentin (NEURONTIN) 300 MG capsule TAKE 1 CAPSULE BY MOUTH THREE TIMES A DAY   rosuvastatin (CRESTOR) 5 MG tablet TAKE 1 TABLET (5 MG TOTAL) BY MOUTH DAILY.   triamterene-hydrochlorothiazide (MAXZIDE-25) 37.5-25 MG tablet TAKE 1 TABLET BY MOUTH EVERY DAY   vitamin C (ASCORBIC ACID) 500 MG tablet Take 500 mg by mouth daily.   [DISCONTINUED] fluticasone (FLONASE) 50 MCG/ACT nasal spray Place 2 sprays into both nostrils daily. (Patient not taking: Reported on 03/09/2023)   No facility-administered  medications prior to visit.    Review of Systems  Gastrointestinal:  Positive for abdominal distention.  All other systems reviewed and are negative.   Last CBC Lab Results  Component Value Date   WBC 6.6 12/06/2020   HGB 12.5 12/06/2020   HCT 37.6 12/06/2020   MCV 94 12/06/2020   MCH 31.1 12/06/2020   RDW 11.8 12/06/2020   PLT 242 12/06/2020   Last metabolic panel Lab Results  Component Value Date   GLUCOSE 90 09/07/2022   NA 144 09/07/2022   K 4.0 09/07/2022   CL 103 09/07/2022   CO2 27 09/07/2022   BUN 13 09/07/2022   CREATININE 1.20 (H) 09/07/2022   EGFR 51 (L) 09/07/2022   CALCIUM 10.0 09/07/2022   PROT 7.1 09/07/2022   ALBUMIN 4.6 09/07/2022   LABGLOB 2.5 09/07/2022   AGRATIO 1.8 09/07/2022   BILITOT 0.4 09/07/2022   ALKPHOS 91 09/07/2022   AST 23 09/07/2022   ALT 16 09/07/2022   ANIONGAP 10 05/17/2020   Last lipids Lab Results  Component Value Date   CHOL 170 09/07/2022   HDL 78 09/07/2022   LDLCALC 79 09/07/2022   TRIG 66 09/07/2022   CHOLHDL 2.1 02/05/2022   Last hemoglobin A1c Lab Results  Component Value Date   HGBA1C 6.3 (H) 09/07/2022   Last thyroid functions Lab Results  Component Value Date   TSH 2.430 06/06/2015      Objective    BP 131/78 (BP Location: Left Arm, Patient Position: Sitting, Cuff Size: Normal)   Pulse 64   Temp 98.1 F (36.7 C) (Oral)   Resp 12   Ht 5\' 5"  (1.651 m)   Wt 161 lb 11.2 oz (73.3 kg)   BMI 26.91 kg/m  BP Readings from Last 3 Encounters:  03/09/23 131/78  10/22/22 137/77  09/07/22 126/81   Wt Readings from Last 3 Encounters:  03/09/23 161 lb 11.2 oz (73.3 kg)  10/22/22 164 lb 1.6 oz (74.4 kg)  09/07/22 167 lb 8 oz (76 kg)      Physical Exam Vitals reviewed.  Constitutional:      General: She is not in acute distress.    Appearance: Normal appearance. She is well-developed. She is not diaphoretic.  HENT:     Head: Normocephalic and atraumatic.     Right Ear: Tympanic membrane, ear canal  and external ear normal.     Left Ear: Tympanic membrane, ear canal and external ear normal.     Nose: Nose normal.     Mouth/Throat:     Mouth: Mucous membranes are  moist.     Pharynx: Oropharynx is clear. No oropharyngeal exudate.  Eyes:     General: No scleral icterus.    Conjunctiva/sclera: Conjunctivae normal.     Pupils: Pupils are equal, round, and reactive to light.  Neck:     Thyroid: No thyromegaly.  Cardiovascular:     Rate and Rhythm: Normal rate and regular rhythm.     Pulses: Normal pulses.     Heart sounds: Normal heart sounds. No murmur heard. Pulmonary:     Effort: Pulmonary effort is normal. No respiratory distress.     Breath sounds: Normal breath sounds. No wheezing or rales.  Abdominal:     General: There is no distension.     Palpations: Abdomen is soft.     Tenderness: There is no abdominal tenderness.  Musculoskeletal:        General: No deformity.     Cervical back: Neck supple.     Right lower leg: No edema.     Left lower leg: No edema.  Lymphadenopathy:     Cervical: No cervical adenopathy.  Skin:    General: Skin is warm and dry.     Findings: No rash.  Neurological:     Mental Status: She is alert and oriented to person, place, and time. Mental status is at baseline.     Gait: Gait normal.  Psychiatric:        Mood and Affect: Mood normal.        Behavior: Behavior normal.        Thought Content: Thought content normal.       Last depression screening scores    03/09/2023    8:34 AM 10/22/2022    9:22 AM 09/07/2022    8:17 AM  PHQ 2/9 Scores  PHQ - 2 Score 0 0 0  PHQ- 9 Score 0 0 0   Last fall risk screening    03/09/2023    8:34 AM  Fall Risk   Falls in the past year? 0  Number falls in past yr: 0  Injury with Fall? 0  Risk for fall due to : No Fall Risks  Follow up Falls evaluation completed   Last Audit-C alcohol use screening    03/09/2023    8:35 AM  Alcohol Use Disorder Test (AUDIT)  1. How often do you have a  drink containing alcohol? 1  2. How many drinks containing alcohol do you have on a typical day when you are drinking? 0  3. How often do you have six or more drinks on one occasion? 0  AUDIT-C Score 1   A score of 3 or more in women, and 4 or more in men indicates increased risk for alcohol abuse, EXCEPT if all of the points are from question 1   No results found for any visits on 03/09/23.  Assessment & Plan    Routine Health Maintenance and Physical Exam  Exercise Activities and Dietary recommendations  Goals      Exercise 150 minutes per week (moderate activity)        Immunization History  Administered Date(s) Administered   Influenza,inj,Quad PF,6+ Mos 09/29/2013, 09/09/2015, 11/19/2016, 08/27/2017, 08/26/2018, 12/04/2019, 09/28/2020, 10/03/2021, 09/07/2022   PFIZER Comirnaty(Gray Top)Covid-19 Tri-Sucrose Vaccine 08/18/2022   PFIZER(Purple Top)SARS-COV-2 Vaccination 02/10/2020, 03/02/2020, 09/28/2020   Pfizer Covid-19 Vaccine Bivalent Booster 75yrs & up 11/29/2021   Tdap 11/26/2017   Zoster Recombinat (Shingrix) 04/22/2021, 07/22/2021    Health Maintenance  Topic Date Due   COVID-19 Vaccine (  6 - 2023-24 season) 10/13/2022   COLONOSCOPY (Pts 45-38yrs Insurance coverage will need to be confirmed)  12/30/2022   INFLUENZA VACCINE  06/24/2023   MAMMOGRAM  01/13/2025   PAP SMEAR-Modifier  02/06/2027   DTaP/Tdap/Td (2 - Td or Tdap) 11/27/2027   Hepatitis C Screening  Completed   HIV Screening  Completed   Zoster Vaccines- Shingrix  Completed   HPV VACCINES  Aged Out    Discussed health benefits of physical activity, and encouraged her to engage in regular exercise appropriate for her age and condition.  Problem List Items Addressed This Visit       Cardiovascular and Mediastinum   Essential hypertension    Well controlled Continue current medications Recheck metabolic panel F/u in 6 months       Relevant Orders   Comprehensive metabolic panel      Genitourinary   Stage 2 chronic kidney disease    Chronic and stable Recheck metabolic panel Avoid nephrotoxic meds       Relevant Orders   Comprehensive metabolic panel     Other   Hyperlipidemia    Previously well controlled Continue statin Repeat FLP and CMP      Relevant Orders   Comprehensive metabolic panel   Lipid Panel With LDL/HDL Ratio   Prediabetes    Recommend low carb diet Recheck A1c       Relevant Orders   Hemoglobin A1c   Other Visit Diagnoses     Encounter for annual physical exam    -  Primary   Relevant Orders   Comprehensive metabolic panel   Lipid Panel With LDL/HDL Ratio   Hemoglobin A1c   Colon cancer screening       Relevant Orders   Ambulatory referral to Gastroenterology        Return in about 6 months (around 09/08/2023) for chronic disease f/u.     I, Shirlee Latch, MD, have reviewed all documentation for this visit. The documentation on 03/09/23 for the exam, diagnosis, procedures, and orders are all accurate and complete.   Bacigalupo, Marzella Schlein, MD, MPH [facility]

## 2023-03-09 ENCOUNTER — Telehealth: Payer: Self-pay

## 2023-03-09 ENCOUNTER — Ambulatory Visit (INDEPENDENT_AMBULATORY_CARE_PROVIDER_SITE_OTHER): Payer: BC Managed Care – PPO | Admitting: Family Medicine

## 2023-03-09 ENCOUNTER — Encounter: Payer: Self-pay | Admitting: Family Medicine

## 2023-03-09 ENCOUNTER — Other Ambulatory Visit: Payer: Self-pay

## 2023-03-09 VITALS — BP 131/78 | HR 64 | Temp 98.1°F | Resp 12 | Ht 65.0 in | Wt 161.7 lb

## 2023-03-09 DIAGNOSIS — Z0000 Encounter for general adult medical examination without abnormal findings: Secondary | ICD-10-CM

## 2023-03-09 DIAGNOSIS — E782 Mixed hyperlipidemia: Secondary | ICD-10-CM | POA: Diagnosis not present

## 2023-03-09 DIAGNOSIS — Z13220 Encounter for screening for lipoid disorders: Secondary | ICD-10-CM | POA: Diagnosis not present

## 2023-03-09 DIAGNOSIS — Z1211 Encounter for screening for malignant neoplasm of colon: Secondary | ICD-10-CM

## 2023-03-09 DIAGNOSIS — N182 Chronic kidney disease, stage 2 (mild): Secondary | ICD-10-CM | POA: Insufficient documentation

## 2023-03-09 DIAGNOSIS — I10 Essential (primary) hypertension: Secondary | ICD-10-CM | POA: Diagnosis not present

## 2023-03-09 DIAGNOSIS — R7303 Prediabetes: Secondary | ICD-10-CM | POA: Diagnosis not present

## 2023-03-09 MED ORDER — GOLYTELY 236 G PO SOLR: 4000.0000 mL | Freq: Once | ORAL | 0 refills | Status: AC

## 2023-03-09 NOTE — Assessment & Plan Note (Signed)
Chronic and stable Recheck metabolic panel Avoid nephrotoxic meds 

## 2023-03-09 NOTE — Assessment & Plan Note (Signed)
Recommend low carb diet °Recheck A1c  °

## 2023-03-09 NOTE — Assessment & Plan Note (Signed)
Previously well controlled Continue statin Repeat FLP and CMP  

## 2023-03-09 NOTE — Telephone Encounter (Signed)
Gastroenterology Pre-Procedure Review  Request Date: 04/16/23 Requesting Physician: Dr. Allegra Lai  PATIENT REVIEW QUESTIONS: The patient responded to the following health history questions as indicated:    1. Are you having any GI issues? no 2. Do you have a personal history of Polyps? no 3. Do you have a family history of Colon Cancer or Polyps? no 4. Diabetes Mellitus? no 5. Joint replacements in the past 12 months?no 6. Major health problems in the past 3 months?no 7. Any artificial heart valves, MVP, or defibrillator?no    MEDICATIONS & ALLERGIES:    Patient reports the following regarding taking any anticoagulation/antiplatelet therapy:   Plavix, Coumadin, Eliquis, Xarelto, Lovenox, Pradaxa, Brilinta, or Effient? no Aspirin? no  Patient confirms/reports the following medications:  Current Outpatient Medications  Medication Sig Dispense Refill   gabapentin (NEURONTIN) 300 MG capsule TAKE 1 CAPSULE BY MOUTH THREE TIMES A DAY 90 capsule 3   rosuvastatin (CRESTOR) 5 MG tablet TAKE 1 TABLET (5 MG TOTAL) BY MOUTH DAILY. 90 tablet 1   triamterene-hydrochlorothiazide (MAXZIDE-25) 37.5-25 MG tablet TAKE 1 TABLET BY MOUTH EVERY DAY 90 tablet 1   vitamin C (ASCORBIC ACID) 500 MG tablet Take 500 mg by mouth daily.     No current facility-administered medications for this visit.    Patient confirms/reports the following allergies:  Allergies  Allergen Reactions   Metoprolol Swelling    No orders of the defined types were placed in this encounter.   AUTHORIZATION INFORMATION Primary Insurance: 1D#: Group #:  Secondary Insurance: 1D#: Group #:  SCHEDULE INFORMATION: Date: 04/16/23 Time: Location: [facility]

## 2023-03-09 NOTE — Assessment & Plan Note (Signed)
Well controlled Continue current medications Recheck metabolic panel F/u in 6 months  

## 2023-03-10 LAB — LIPID PANEL WITH LDL/HDL RATIO
Cholesterol, Total: 166 mg/dL (ref 100–199)
HDL: 79 mg/dL (ref >39)
LDL Chol Calc (NIH): 75 mg/dL (ref 0–99)
LDL/HDL Ratio: 0.9 ratio (ref 0.0–3.2)
Triglycerides: 62 mg/dL (ref 0–149)
VLDL Cholesterol Cal: 12 mg/dL (ref 5–40)

## 2023-03-10 LAB — COMPREHENSIVE METABOLIC PANEL
ALT: 24 IU/L (ref 0–32)
AST: 31 IU/L (ref 0–40)
Albumin/Globulin Ratio: 1.7 (ref 1.2–2.2)
Albumin: 4.8 g/dL (ref 3.9–4.9)
Alkaline Phosphatase: 94 IU/L (ref 44–121)
BUN/Creatinine Ratio: 14 (ref 12–28)
BUN: 17 mg/dL (ref 8–27)
Bilirubin Total: 0.4 mg/dL (ref 0.0–1.2)
CO2: 24 mmol/L (ref 20–29)
Calcium: 10.8 mg/dL — ABNORMAL HIGH (ref 8.7–10.3)
Chloride: 106 mmol/L (ref 96–106)
Creatinine, Ser: 1.24 mg/dL — ABNORMAL HIGH (ref 0.57–1.00)
Globulin, Total: 2.9 g/dL (ref 1.5–4.5)
Glucose: 96 mg/dL (ref 70–99)
Potassium: 4.7 mmol/L (ref 3.5–5.2)
Sodium: 147 mmol/L — ABNORMAL HIGH (ref 134–144)
Total Protein: 7.7 g/dL (ref 6.0–8.5)
eGFR: 50 mL/min/{1.73_m2} — ABNORMAL LOW (ref >59)

## 2023-03-10 LAB — HEMOGLOBIN A1C
Est. average glucose Bld gHb Est-mCnc: 131 mg/dL
Hgb A1c MFr Bld: 6.2 % — ABNORMAL HIGH (ref 4.8–5.6)

## 2023-03-11 ENCOUNTER — Other Ambulatory Visit: Payer: Self-pay | Admitting: Family Medicine

## 2023-03-11 DIAGNOSIS — I10 Essential (primary) hypertension: Secondary | ICD-10-CM

## 2023-03-11 NOTE — Progress Notes (Signed)
Normal/stable labs, except for high sodium and calcium. You may be dehydrated. Hold any calcium supplement you are taking, hydrate well, low salt diet, and recheck CMP in 1 month.

## 2023-03-29 ENCOUNTER — Encounter: Payer: Self-pay | Admitting: Family Medicine

## 2023-03-29 ENCOUNTER — Ambulatory Visit (INDEPENDENT_AMBULATORY_CARE_PROVIDER_SITE_OTHER): Payer: BC Managed Care – PPO | Admitting: Family Medicine

## 2023-03-29 VITALS — BP 138/95 | HR 65 | Temp 98.0°F | Resp 15 | Ht 66.0 in | Wt 160.5 lb

## 2023-03-29 DIAGNOSIS — N390 Urinary tract infection, site not specified: Secondary | ICD-10-CM

## 2023-03-29 DIAGNOSIS — R3989 Other symptoms and signs involving the genitourinary system: Secondary | ICD-10-CM

## 2023-03-29 DIAGNOSIS — I10 Essential (primary) hypertension: Secondary | ICD-10-CM | POA: Diagnosis not present

## 2023-03-29 LAB — POCT URINALYSIS DIPSTICK
Bilirubin, UA: NEGATIVE
Blood, UA: NEGATIVE
Glucose, UA: NEGATIVE
Ketones, UA: NEGATIVE
Nitrite, UA: NEGATIVE
Protein, UA: POSITIVE — AB
Spec Grav, UA: <=1.005 — AB (ref 1.010–1.025)
Urobilinogen, UA: 0.2 E.U./dL
pH, UA: 7.5 (ref 5.0–8.0)

## 2023-03-29 MED ORDER — SULFAMETHOXAZOLE-TRIMETHOPRIM 800-160 MG PO TABS
1.0000 | ORAL_TABLET | Freq: Two times a day (BID) | ORAL | 0 refills | Status: AC
Start: 2023-03-29 — End: 2023-04-01

## 2023-03-29 NOTE — Progress Notes (Signed)
I,Sha'taria Tyson,acting as a Neurosurgeon for [employer], DO.,have documented all relevant documentation on the behalf of [employer], DO,as directed by  [employer], DO while in the presence of Sherlyn Hay, DO.   Established patient visit   Patient: Caroline Bowen   DOB: 26-May-1961   62 y.o. Female  MRN: [id number] Visit Date: 03/29/2023  Today's healthcare provider: Sherlyn Hay, DO   Chief Complaint  Patient presents with   Dysuria   Subjective    HPI  -Colonoscopy: referral sent 02/2023 Urinary symptoms  She reports new onset  stinging sensation while urinating . The current episode started a few days ago and is staying constant. Patient states symptoms are 3/10 in intensity, occurring intermittently. She  has not been recently treated for similar symptoms.   Associated symptoms: No abdominal pain No back pain  No chills No constipation  No cramping No diarrhea  No discharge No fever  No hematuria No nausea  No vomiting    ---------------------------------------------------------------------------------------   Medications: Outpatient Medications Prior to Visit  Medication Sig   gabapentin (NEURONTIN) 300 MG capsule TAKE 1 CAPSULE BY MOUTH THREE TIMES A DAY   rosuvastatin (CRESTOR) 5 MG tablet TAKE 1 TABLET (5 MG TOTAL) BY MOUTH DAILY.   triamterene-hydrochlorothiazide (MAXZIDE-25) 37.5-25 MG tablet TAKE 1 TABLET BY MOUTH EVERY DAY   vitamin C (ASCORBIC ACID) 500 MG tablet Take 500 mg by mouth daily.   fluocinonide ointment (LIDEX) 0.05 % Apply topically. (Patient not taking: Reported on 03/29/2023)   No facility-administered medications prior to visit.    Review of Systems  Constitutional:  Negative for chills, fatigue and fever.  Respiratory:  Negative for shortness of breath.   Cardiovascular:  Negative for chest pain.  Genitourinary:  Positive for dysuria. Negative for decreased urine volume, difficulty urinating, flank pain, frequency, hematuria,  pelvic pain and urgency.       Objective    BP (!) 138/95   Pulse 65   Temp 98 F (36.7 C)   Resp 15   Ht 5\' 6"  (1.676 m)   Wt 160 lb 8 oz (72.8 kg)   SpO2 100%   BMI 25.91 kg/m    Physical Exam Vitals reviewed.  Constitutional:      General: She is not in acute distress.    Appearance: She is well-developed.  HENT:     Head: Normocephalic and atraumatic.  Eyes:     General: No scleral icterus.    Conjunctiva/sclera: Conjunctivae normal.  Cardiovascular:     Rate and Rhythm: Normal rate and regular rhythm.     Heart sounds: Normal heart sounds. No murmur heard. Pulmonary:     Effort: Pulmonary effort is normal. No respiratory distress.     Breath sounds: Normal breath sounds. No wheezing or rales.  Abdominal:     General: Bowel sounds are normal. There is no distension.     Palpations: Abdomen is soft.     Tenderness: There is no abdominal tenderness. There is no guarding or rebound.  Genitourinary:    Comments: Negative Lloyd's sign bilaterally Skin:    General: Skin is warm and dry.     Capillary Refill: Capillary refill takes less than 2 seconds.     Findings: No rash.  Neurological:     Mental Status: She is alert and oriented to person, place, and time.  Psychiatric:        Behavior: Behavior normal.  Results for orders placed or performed in visit on 03/29/23  POCT Urinalysis Dipstick  Result Value Ref Range   Color, UA     Clarity, UA     Glucose, UA Negative Negative   Bilirubin, UA negative    Ketones, UA negative    Spec Grav, UA <=1.005 (A) 1.010 - 1.025   Blood, UA negative    pH, UA 7.5 5.0 - 8.0   Protein, UA Positive (A) Negative   Urobilinogen, UA 0.2 0.2 or 1.0 E.U./dL   Nitrite, UA negative    Leukocytes, UA Moderate (2+) (A) Negative   Appearance     Odor      Assessment & Plan     1. UTI (urinary tract infection), uncomplicated Patient's urinalysis was positive for leukocytes.  She denies taking any NSAIDs, PPIs or  antibiotics recently which may have contributed to the presence of leukocytes.  Will go ahead and check a urine culture and treat with 3 days of Bactrim as noted below.  Patient aware to follow-up if symptoms do not improve. - Urine Culture - sulfamethoxazole-trimethoprim (BACTRIM DS) 800-160 MG tablet; Take 1 tablet by mouth 2 (two) times daily for 3 days.  Dispense: 6 tablet; Refill: 0  2. Suspected UTI As above. - POCT Urinalysis Dipstick  3. Essential hypertension Discussed with patient that her blood pressure is elevated today and was mildly elevated at her last visit.  Encouraged her to ensure she is taking her medication on time as scheduled.  Patient will start checking her blood pressures more regularly at home, as she had not been, and follow-up with updated blood pressures.  Will defer medication adjustment to the next visit.    No follow-ups on file.      The entirety of the information documented in the History of Present Illness, Review of Systems and Physical Exam were personally obtained by me. Portions of this information were initially documented by the CMA,Sha'taria Chales Abrahams, and reviewed by me for thoroughness and accuracy.     Sherlyn Hay, DO  [facility] [phone] (phone) [phone] (fax)  [facility]

## 2023-03-29 NOTE — Assessment & Plan Note (Signed)
Discussed with patient that her blood pressure is elevated today and was mildly elevated at her last visit.  Encouraged her to ensure she is taking her medication on time as scheduled.  Patient will start checking her blood pressures more regularly at home, as she had not been, and follow-up with updated blood pressures.  Will defer medication adjustment to the next visit.

## 2023-03-31 DIAGNOSIS — N390 Urinary tract infection, site not specified: Secondary | ICD-10-CM | POA: Diagnosis not present

## 2023-04-02 LAB — URINE CULTURE: Organism ID, Bacteria: NO GROWTH

## 2023-04-02 LAB — SPECIMEN STATUS REPORT

## 2023-04-08 ENCOUNTER — Telehealth: Payer: Self-pay

## 2023-04-08 NOTE — Telephone Encounter (Signed)
Copied from CRM [id number]. Topic: Appointment Scheduling - Scheduling Inquiry for Clinic >> Apr 08, 2023  2:35 PM Turkey B wrote: Reason for CRM: pt called in states, Dr Beryle Flock wants her to do more labs. Please cb to schedule.

## 2023-04-09 DIAGNOSIS — I10 Essential (primary) hypertension: Secondary | ICD-10-CM | POA: Diagnosis not present

## 2023-04-09 NOTE — Telephone Encounter (Signed)
Patient advised lab is active.

## 2023-04-10 LAB — COMPREHENSIVE METABOLIC PANEL
ALT: 24 IU/L (ref 0–32)
AST: 27 IU/L (ref 0–40)
Albumin/Globulin Ratio: 1.6 (ref 1.2–2.2)
Albumin: 4.6 g/dL (ref 3.9–4.9)
Alkaline Phosphatase: 90 IU/L (ref 44–121)
BUN/Creatinine Ratio: 17 (ref 12–28)
BUN: 19 mg/dL (ref 8–27)
Bilirubin Total: 0.3 mg/dL (ref 0.0–1.2)
CO2: 26 mmol/L (ref 20–29)
Calcium: 10.3 mg/dL (ref 8.7–10.3)
Chloride: 102 mmol/L (ref 96–106)
Creatinine, Ser: 1.15 mg/dL — ABNORMAL HIGH (ref 0.57–1.00)
Globulin, Total: 2.9 g/dL (ref 1.5–4.5)
Glucose: 71 mg/dL (ref 70–99)
Potassium: 4 mmol/L (ref 3.5–5.2)
Sodium: 142 mmol/L (ref 134–144)
Total Protein: 7.5 g/dL (ref 6.0–8.5)
eGFR: 54 mL/min/{1.73_m2} — ABNORMAL LOW (ref >59)

## 2023-04-12 ENCOUNTER — Ambulatory Visit: Payer: BC Managed Care – PPO | Admitting: Family Medicine

## 2023-04-15 ENCOUNTER — Encounter: Payer: Self-pay | Admitting: Gastroenterology

## 2023-04-16 ENCOUNTER — Encounter
Admission: RE | Disposition: A | Discharge status: Home / Self Care | Payer: Self-pay | Source: Home / Self Care | Attending: Gastroenterology

## 2023-04-16 ENCOUNTER — Ambulatory Visit
Admission: RE | Admit: 2023-04-16 | Discharge: 2023-04-16 | Disposition: A | Discharge status: Home / Self Care | Payer: BC Managed Care – PPO | Attending: Gastroenterology | Admitting: Gastroenterology

## 2023-04-16 ENCOUNTER — Ambulatory Visit: Payer: BC Managed Care – PPO | Admitting: Anesthesiology

## 2023-04-16 DIAGNOSIS — I10 Essential (primary) hypertension: Secondary | ICD-10-CM | POA: Insufficient documentation

## 2023-04-16 DIAGNOSIS — D122 Benign neoplasm of ascending colon: Secondary | ICD-10-CM

## 2023-04-16 DIAGNOSIS — K635 Polyp of colon: Secondary | ICD-10-CM | POA: Diagnosis not present

## 2023-04-16 DIAGNOSIS — K649 Unspecified hemorrhoids: Secondary | ICD-10-CM | POA: Diagnosis not present

## 2023-04-16 DIAGNOSIS — Z79899 Other long term (current) drug therapy: Secondary | ICD-10-CM | POA: Insufficient documentation

## 2023-04-16 DIAGNOSIS — K644 Residual hemorrhoidal skin tags: Secondary | ICD-10-CM | POA: Diagnosis not present

## 2023-04-16 DIAGNOSIS — Z1211 Encounter for screening for malignant neoplasm of colon: Secondary | ICD-10-CM | POA: Diagnosis not present

## 2023-04-16 DIAGNOSIS — D126 Benign neoplasm of colon, unspecified: Secondary | ICD-10-CM | POA: Diagnosis not present

## 2023-04-16 HISTORY — PX: COLONOSCOPY WITH PROPOFOL: SHX5780

## 2023-04-16 SURGERY — COLONOSCOPY WITH PROPOFOL
Anesthesia: General

## 2023-04-16 MED ORDER — SODIUM CHLORIDE 0.9 % IV SOLN
INTRAVENOUS | Status: DC
  Administered 2023-04-16: 20 mL/h via INTRAVENOUS

## 2023-04-16 MED ORDER — PROPOFOL 500 MG/50ML IV EMUL
INTRAVENOUS | Status: DC | PRN
  Administered 2023-04-16: 140 ug/kg/min via INTRAVENOUS

## 2023-04-16 MED ORDER — STERILE WATER FOR IRRIGATION IR SOLN
Status: DC | PRN
  Administered 2023-04-16: 60 mL

## 2023-04-16 MED ORDER — PROPOFOL 10 MG/ML IV BOLUS
INTRAVENOUS | Status: DC | PRN
  Administered 2023-04-16: 80 mg via INTRAVENOUS

## 2023-04-16 NOTE — Anesthesia Preprocedure Evaluation (Signed)
Anesthesia Evaluation  Patient identified by MRN, date of birth, ID band Patient awake    Reviewed: Allergy & Precautions, NPO status , Patient's Chart, lab work & pertinent test results  Airway Mallampati: II  TM Distance: >3 FB Neck ROM: Full    Dental  (+) Teeth Intact   Pulmonary neg pulmonary ROS   Pulmonary exam normal breath sounds clear to auscultation       Cardiovascular hypertension, Pt. on medications negative cardio ROS Normal cardiovascular exam Rhythm:Regular Rate:Normal     Neuro/Psych  Headaches negative neurological ROS  negative psych ROS   GI/Hepatic negative GI ROS, Neg liver ROS,,,  Endo/Other  negative endocrine ROS    Renal/GU negative Renal ROS  negative genitourinary   Musculoskeletal   Abdominal Normal abdominal exam  (+)   Peds negative pediatric ROS (+)  Hematology negative hematology ROS (+) Blood dyscrasia, anemia   Anesthesia Other Findings Past Medical History: No date: Anemia No date: Hypertension  Past Surgical History: 06/18/2016: CARDIAC CATHETERIZATION     Comment:  Normal coronaries. Normal EF=60%. Dr. Welton Flakes 2001: CYST EXCISION     Comment:  wrist 2001: wrist surgery  BMI    Body Mass Index: 25.63 kg/m      Reproductive/Obstetrics negative OB ROS                             Anesthesia Physical Anesthesia Plan  ASA: 2  Anesthesia Plan: General   Post-op Pain Management:    Induction: Intravenous  PONV Risk Score and Plan: Propofol infusion and TIVA  Airway Management Planned: Natural Airway  Additional Equipment:   Intra-op Plan:   Post-operative Plan:   Informed Consent: I have reviewed the patients History and Physical, chart, labs and discussed the procedure including the risks, benefits and alternatives for the proposed anesthesia with the patient or authorized representative who has indicated his/her understanding and  acceptance.     Dental Advisory Given  Plan Discussed with: CRNA and Surgeon  Anesthesia Plan Comments:        Anesthesia Quick Evaluation

## 2023-04-16 NOTE — H&P (Signed)
Caroline Repress, MD [address]  [address]  [place], [place] [address]  Main: [phone]  Fax: [phone] Pager: [phone]  Primary Care Physician:  Erasmo Downer, MD Primary Gastroenterologist:  Dr. Arlyss Bowen  Pre-Procedure History & Physical: HPI:  Caroline Bowen is a 62 y.o. female is here for an colonoscopy.   Past Medical History:  Diagnosis Date   Anemia    Hypertension     Past Surgical History:  Procedure Laterality Date   CARDIAC CATHETERIZATION  06/18/2016   Normal coronaries. Normal EF=60%. Dr. Welton Flakes   CYST EXCISION  2001   wrist   wrist surgery  2001    Prior to Admission medications   Medication Sig Start Date End Date Taking? Authorizing Provider  gabapentin (NEURONTIN) 300 MG capsule TAKE 1 CAPSULE BY MOUTH THREE TIMES A DAY 08/18/22  Yes Rumball, Darl Householder, DO  rosuvastatin (CRESTOR) 5 MG tablet TAKE 1 TABLET (5 MG TOTAL) BY MOUTH DAILY. 11/26/22  Yes Bacigalupo, Marzella Schlein, MD  triamterene-hydrochlorothiazide (MAXZIDE-25) 37.5-25 MG tablet TAKE 1 TABLET BY MOUTH EVERY DAY 11/19/22  Yes Simmons-Robinson, Makiera, MD  vitamin C (ASCORBIC ACID) 500 MG tablet Take 500 mg by mouth daily.   Yes [provider]  fluocinonide ointment (LIDEX) 0.05 % Apply topically. Patient not taking: Reported on 03/29/2023 01/08/12   [provider]    Allergies as of 03/09/2023 - Review Complete 03/09/2023  Allergen Reaction Noted   Metoprolol Swelling 05/24/2015    Family History  Problem Relation Age of Onset   Hypertension Mother    Cancer Mother        ovarian   Ovarian cancer Mother    Hypertension Father    Diabetes Father    Kidney disease Father    Prostate cancer Father    Heart failure Father    Diabetes Sister    Hypertension Sister    Hypertension Brother    Stroke Brother    Hypertension Sister    Pancreatic cancer Brother    Hypertension Brother    Breast cancer Neg Hx    Colon cancer Neg Hx     Social History    Socioeconomic History   Marital status: Married    Spouse name: Trinna Post   Number of children: 1   Years of education: 16   Highest education level: Bachelor's degree (e.g., BA, AB, BS)  Occupational History   Not on file  Tobacco Use   Smoking status: Never   Smokeless tobacco: Never  Vaping Use   Vaping Use: Never used  Substance and Sexual Activity   Alcohol use: No    Comment: very rare   Drug use: No   Sexual activity: Yes    Birth control/protection: Post-menopausal  Other Topics Concern   Not on file  Social History Narrative   Not on file   Social Determinants of Health   Financial Resource Strain: Low Risk  (11/26/2017)   Overall Financial Resource Strain (CARDIA)    Difficulty of Paying Living Expenses: Not hard at all  Food Insecurity: No Food Insecurity (11/26/2017)   Hunger Vital Sign    Worried About Running Out of Food in the Last Year: Never true    Ran Out of Food in the Last Year: Never true  Transportation Needs: No Transportation Needs (11/26/2017)   PRAPARE - Administrator, Civil Service (Medical): No    Lack of Transportation (Non-Medical): No  Physical Activity: Insufficiently Active (11/26/2017)  Exercise Vital Sign    Days of Exercise per Week: 3 days    Minutes of Exercise per Session: 20 min  Stress: Not on file  Social Connections: Not on file  Intimate Partner Violence: Not on file    Review of Systems: See HPI, otherwise negative ROS  Physical Exam: BP (!) 157/89   Pulse (!) 59   Temp (!) 97.1 F (36.2 C) (Temporal)   Resp 20   Ht 5\' 6"  (1.676 m)   Wt 72 kg   SpO2 98%   BMI 25.63 kg/m  General:   Alert,  pleasant and cooperative in NAD Head:  Normocephalic and atraumatic. Neck:  Supple; no masses or thyromegaly. Lungs:  Clear throughout to auscultation.    Heart:  Regular rate and rhythm. Abdomen:  Soft, nontender and nondistended. Normal bowel sounds, without guarding, and without rebound.   Neurologic:  Alert and   oriented x4;  grossly normal neurologically.  Impression/Plan: Caroline Bowen is here for an colonoscopy to be performed for colon cancer screening  Risks, benefits, limitations, and alternatives regarding  colonoscopy have been reviewed with the patient.  Questions have been answered.  All parties agreeable.   Lannette Donath, MD  04/16/2023, 9:20 AM

## 2023-04-16 NOTE — Op Note (Signed)
[facility]astroenter[facility] Patient Name: Caroline Bowen Procedure Date: 04/16/2023 9:45 AM MRN: [id number] Account #: [id number] Date of Birth: 1961-09-03 Admit Type: Outpatient Age: 62 Room: [facility] ENDO ROOM 2 Gender: Female Note Status: Finalized Instrument Name: Prentice Docker [id number] Procedure:             Colonoscopy Indications:           Screening for colorectal malignant neoplasm, Last                         colonoscopy: February 2014, Last colonoscopy 10 years                         ago Providers:             Toney Reil MD, MD Referring MD:          Marzella Schlein. Bacigalupo (Referring MD) Medicines:             General Anesthesia Complications:         No immediate complications. Estimated blood loss: None. Procedure:             Pre-Anesthesia Assessment:                        - Prior to the procedure, a History and Physical was                         performed, and patient medications and allergies were                         reviewed. The patient is competent. The risks and                         benefits of the procedure and the sedation options and                         risks were discussed with the patient. All questions                         were answered and informed consent was obtained.                         Patient identification and proposed procedure were                         verified by the physician, the nurse, the                         anesthesiologist, the anesthetist and the technician                         in the pre-procedure area in the procedure room in the                         endoscopy suite. Mental Status Examination: alert and                         oriented. Airway Examination: normal oropharyngeal  airway and neck mobility. Respiratory Examination:                         clear to auscultation. CV Examination: normal.                         Prophylactic Antibiotics: The patient does  not require                         prophylactic antibiotics. Prior Anticoagulants: The                         patient has taken no anticoagulant or antiplatelet                         agents. ASA Grade Assessment: II - A patient with mild                         systemic disease. After reviewing the risks and                         benefits, the patient was deemed in satisfactory                         condition to undergo the procedure. The anesthesia                         plan was to use general anesthesia. Immediately prior                         to administration of medications, the patient was                         re-assessed for adequacy to receive sedatives. The                         heart rate, respiratory rate, oxygen saturations,                         blood pressure, adequacy of pulmonary ventilation, and                         response to care were monitored throughout the                         procedure. The physical status of the patient was                         re-assessed after the procedure.                        After obtaining informed consent, the colonoscope was                         passed under direct vision. Throughout the procedure,                         the patient's blood pressure, pulse, and oxygen  saturations were monitored continuously. The                         Colonoscope was introduced through the anus and                         advanced to the the cecum, identified by appendiceal                         orifice and ileocecal valve. The colonoscopy was                         performed without difficulty. The patient tolerated                         the procedure well. The quality of the bowel                         preparation was evaluated using the BBPS [facility] Bowel                         Preparation Scale) with scores of: Right Colon = 3,                         Transverse Colon = 3 and Left Colon =  3 (entire mucosa                         seen well with no residual staining, small fragments                         of stool or opaque liquid). The total BBPS score                         equals 9. The ileocecal valve, appendiceal orifice,                         and rectum were photographed. Findings:      The perianal and digital rectal examinations were normal. Pertinent       negatives include normal sphincter tone and no palpable rectal lesions.      A 10 mm polyp was found in the ascending colon. The polyp was sessile.       The polyp was removed with a cold snare. Resection and retrieval were       complete. Estimated blood loss was minimal. To stop bleeding after the       polypectomy, hemostatic gel was deployed. There was no bleeding at the       end of the procedure.      Non-bleeding external hemorrhoids were found during endoscopy. The       hemorrhoids were medium-sized. Impression:            - One 10 mm polyp in the ascending colon, removed with                         a cold snare. Resected and retrieved. Hemostatic spray                         applied.                        -  Non-bleeding external hemorrhoids. Recommendation:        - Discharge patient to home (with escort).                        - Resume previous diet today.                        - Continue present medications.                        - Await pathology results.                        - Repeat colonoscopy in 5 years for surveillance. Procedure Code(s):     --- Professional ---                        [phone], Colonoscopy, flexible; with removal of                         tumor(s), polyp(s), or other lesion(s) by snare                         technique Diagnosis Code(s):     --- Professional ---                        Z12.11, Encounter for screening for malignant neoplasm                         of colon                        D12.2, Benign neoplasm of ascending colon                        K64.4,  Residual hemorrhoidal skin tags CPT copyright 2022 American Medical Association. All rights reserved. The codes documented in this report are preliminary and upon coder review may  be revised to meet current compliance requirements. Dr. Libby Maw Toney Reil MD, MD 04/16/2023 10:21:56 AM This report has been signed electronically. Number of Addenda: 0 Note Initiated On: 04/16/2023 9:45 AM Scope Withdrawal Time: 0 hours 14 minutes 31 seconds  Total Procedure Duration: 0 hours 18 minutes 49 seconds  Estimated Blood Loss:  Estimated blood loss: none.      [facility]

## 2023-04-16 NOTE — Transfer of Care (Signed)
Immediate Anesthesia Transfer of Care Note  Patient: Caroline Bowen  Procedure(s) Performed: COLONOSCOPY WITH PROPOFOL  Patient Location: PACU  Anesthesia Type:General  Level of Consciousness: awake, alert , and oriented  Airway & Oxygen Therapy: Patient Spontanous Breathing  Post-op Assessment: Report given to RN and Post -op Vital signs reviewed and stable  Post vital signs: Reviewed and stable  Last Vitals:  Vitals Value Taken Time  BP 111/65 04/16/23 1023  Temp 36.1 C 04/16/23 1023  Pulse 71 04/16/23 1024  Resp 15 04/16/23 1024  SpO2 97 % 04/16/23 1024  Vitals shown include unvalidated device data.  Last Pain:  Vitals:   04/16/23 1023  TempSrc: Temporal  PainSc: Asleep         Complications: No notable events documented.

## 2023-04-16 NOTE — Anesthesia Postprocedure Evaluation (Signed)
Anesthesia Post Note  Patient: Caroline Bowen  Procedure(s) Performed: COLONOSCOPY WITH PROPOFOL  Patient location during evaluation: PACU Anesthesia Type: General Level of consciousness: awake and awake and alert Pain management: satisfactory to patient Vital Signs Assessment: post-procedure vital signs reviewed and stable Respiratory status: spontaneous breathing and nonlabored ventilation Cardiovascular status: stable Anesthetic complications: no   No notable events documented.   Last Vitals:  Vitals:   04/16/23 1023 04/16/23 1033  BP: 111/65 103/66  Pulse: 72 65  Resp: 15 15  Temp: (!) 36.1 C   SpO2: 97% 98%    Last Pain:  Vitals:   04/16/23 1033  TempSrc:   PainSc: 0-No pain                 VAN STAVEREN,Stacia Feazell

## 2023-04-20 ENCOUNTER — Encounter: Payer: Self-pay | Admitting: Gastroenterology

## 2023-04-22 LAB — SURGICAL PATHOLOGY

## 2023-04-23 ENCOUNTER — Encounter: Payer: Self-pay | Admitting: Gastroenterology

## 2023-05-13 ENCOUNTER — Other Ambulatory Visit: Payer: Self-pay | Admitting: Family Medicine

## 2023-05-13 DIAGNOSIS — I10 Essential (primary) hypertension: Secondary | ICD-10-CM

## 2023-05-13 NOTE — Telephone Encounter (Signed)
Requested Prescriptions  Pending Prescriptions Disp Refills   triamterene-hydrochlorothiazide (MAXZIDE-25) 37.5-25 MG tablet [Pharmacy Med Name: TRIAMTERENE-HCTZ 37.5-25 MG TB] 90 tablet 1    Sig: TAKE 1 TABLET BY MOUTH EVERY DAY     Cardiovascular: Diuretic Combos Failed - 05/13/2023  2:28 AM      Failed - Cr in normal range and within 180 days    Creatinine  Date Value Ref Range Status  06/17/2014 1.22 0.60 - 1.30 mg/dL Final   Creatinine, Ser  Date Value Ref Range Status  04/09/2023 1.15 (H) 0.57 - 1.00 mg/dL Final         Passed - K in normal range and within 180 days    Potassium  Date Value Ref Range Status  04/09/2023 4.0 3.5 - 5.2 mmol/L Final  06/17/2014 3.6 3.5 - 5.1 mmol/L Final         Passed - Na in normal range and within 180 days    Sodium  Date Value Ref Range Status  04/09/2023 142 134 - 144 mmol/L Final  06/17/2014 140 136 - 145 mmol/L Final         Passed - Last BP in normal range    BP Readings from Last 1 Encounters:  04/16/23 103/66         Passed - Valid encounter within last 6 months    Recent Outpatient Visits           1 month ago UTI (urinary tract infection), uncomplicated   [facility] [place], Monico Blitz, DO   2 months ago Encounter for annual physical exam   [facility] [place], Marzella Schlein, MD   6 months ago Rhinosinusitis   [facility] [facility] [place], [place], PA-C   8 months ago Essential hypertension   [facility] [place], Marzella Schlein, MD   1 year ago Essential hypertension   [facility] [place], Marzella Schlein, MD       Future Appointments             In 3 months Bacigalupo, Marzella Schlein, MD [facility], [facility]

## 2023-05-25 ENCOUNTER — Other Ambulatory Visit: Payer: Self-pay | Admitting: Family Medicine

## 2023-05-25 DIAGNOSIS — E782 Mixed hyperlipidemia: Secondary | ICD-10-CM

## 2023-06-28 DIAGNOSIS — H5213 Myopia, bilateral: Secondary | ICD-10-CM | POA: Diagnosis not present

## 2023-06-28 DIAGNOSIS — H52223 Regular astigmatism, bilateral: Secondary | ICD-10-CM | POA: Diagnosis not present

## 2023-06-28 DIAGNOSIS — H524 Presbyopia: Secondary | ICD-10-CM | POA: Diagnosis not present

## 2023-09-05 ENCOUNTER — Other Ambulatory Visit: Payer: Self-pay | Admitting: Family Medicine

## 2023-09-05 DIAGNOSIS — I10 Essential (primary) hypertension: Secondary | ICD-10-CM

## 2023-09-06 NOTE — Telephone Encounter (Signed)
Requested Prescriptions  Refused Prescriptions Disp Refills   triamterene-hydrochlorothiazide (MAXZIDE-25) 37.5-25 MG tablet [Pharmacy Med Name: TRIAMTERENE-HCTZ 37.5-25 MG TB] 90 tablet 1    Sig: TAKE 1 TABLET BY MOUTH EVERY DAY     Cardiovascular: Diuretic Combos Failed - 09/05/2023  8:10 AM      Failed - Cr in normal range and within 180 days    Creatinine  Date Value Ref Range Status  06/17/2014 1.22 0.60 - 1.30 mg/dL Final   Creatinine, Ser  Date Value Ref Range Status  04/09/2023 1.15 (H) 0.57 - 1.00 mg/dL Final         Passed - K in normal range and within 180 days    Potassium  Date Value Ref Range Status  04/09/2023 4.0 3.5 - 5.2 mmol/L Final  06/17/2014 3.6 3.5 - 5.1 mmol/L Final         Passed - Na in normal range and within 180 days    Sodium  Date Value Ref Range Status  04/09/2023 142 134 - 144 mmol/L Final  06/17/2014 140 136 - 145 mmol/L Final         Passed - Last BP in normal range    BP Readings from Last 1 Encounters:  04/16/23 103/66         Passed - Valid encounter within last 6 months    Recent Outpatient Visits           5 months ago UTI (urinary tract infection), uncomplicated   [facility] [place], Monico Blitz, DO   6 months ago Encounter for annual physical exam   [facility] [place], Marzella Schlein, MD   10 months ago Rhinosinusitis   [facility] [place], [place], [place]   12 months ago Essential hypertension   [place] [facility] [place], Marzella Schlein, MD   1 year ago Essential hypertension   [facility] [place], Marzella Schlein, MD       Future Appointments             In 3 days Bacigalupo, Marzella Schlein, MD [facility], [facility]

## 2023-09-09 ENCOUNTER — Ambulatory Visit: Payer: BC Managed Care – PPO | Admitting: Family Medicine

## 2023-09-09 ENCOUNTER — Encounter: Payer: Self-pay | Admitting: Family Medicine

## 2023-09-09 VITALS — BP 120/89 | HR 62 | Temp 97.8°F | Ht 66.0 in | Wt 154.9 lb

## 2023-09-09 DIAGNOSIS — E782 Mixed hyperlipidemia: Secondary | ICD-10-CM

## 2023-09-09 DIAGNOSIS — I10 Essential (primary) hypertension: Secondary | ICD-10-CM | POA: Diagnosis not present

## 2023-09-09 DIAGNOSIS — R7303 Prediabetes: Secondary | ICD-10-CM | POA: Diagnosis not present

## 2023-09-09 DIAGNOSIS — Z23 Encounter for immunization: Secondary | ICD-10-CM | POA: Diagnosis not present

## 2023-09-09 DIAGNOSIS — N182 Chronic kidney disease, stage 2 (mild): Secondary | ICD-10-CM | POA: Diagnosis not present

## 2023-09-09 MED ORDER — TRIAMTERENE-HCTZ 37.5-25 MG PO TABS: 1.0000 | ORAL_TABLET | Freq: Every day | ORAL | 3 refills | Status: DC

## 2023-09-09 NOTE — Assessment & Plan Note (Signed)
Reports increased water intake and consistent walking. Recent weight loss noted. -Check A1C today. -Continue current lifestyle modifications.

## 2023-09-09 NOTE — Progress Notes (Signed)
Established Patient Office Visit  Subjective   Patient ID: Caroline Bowen, female    DOB: [birth year]  Age: 62 y.o. MRN: [id number]  Chief Complaint  Patient presents with   Follow-up    6 month follow up. Pt needs refill on MAXIDE-25 as well.    HPI  Discussed the use of AI scribe software for clinical note transcription with the patient, who gave verbal consent to proceed.  History of Present Illness   The patient, with a history of prediabetes, high cholesterol, and high blood pressure, presents today for a routine follow-up. She reports feeling tired and drained due to the stress of caring for her elderly mother, who has been experiencing health issues and is living in suboptimal conditions. The patient expresses frustration and emotional distress over her mother's situation, which is causing her significant stress. She also mentions occasional headaches, which she attributes to stress. The patient is currently undergoing orthodontic treatment and has noticed a decrease in snacking due to the inconvenience of removing her orthodontic appliances. She has also been making efforts to increase her water intake and maintain regular physical activity.        ROS    Objective:     BP 120/89   Pulse 62   Temp 97.8 F (36.6 C) (Oral)   Ht 5\' 6"  (1.676 m)   Wt 154 lb 14.4 oz (70.3 kg)   SpO2 100%   BMI 25.00 kg/m    Physical Exam Vitals reviewed.  Constitutional:      General: She is not in acute distress.    Appearance: Normal appearance. She is well-developed. She is not diaphoretic.  HENT:     Head: Normocephalic and atraumatic.  Eyes:     General: No scleral icterus.    Conjunctiva/sclera: Conjunctivae normal.  Neck:     Thyroid: No thyromegaly.  Cardiovascular:     Rate and Rhythm: Normal rate and regular rhythm.     Heart sounds: Normal heart sounds. No murmur heard. Pulmonary:     Effort: Pulmonary effort is normal. No respiratory distress.     Breath sounds:  Normal breath sounds. No wheezing, rhonchi or rales.  Musculoskeletal:     Cervical back: Neck supple.     Right lower leg: No edema.     Left lower leg: No edema.  Lymphadenopathy:     Cervical: No cervical adenopathy.  Skin:    General: Skin is warm and dry.     Findings: No rash.  Neurological:     Mental Status: She is alert and oriented to person, place, and time. Mental status is at baseline.  Psychiatric:        Mood and Affect: Mood normal.        Behavior: Behavior normal.      No results found for any visits on 09/09/23.    The 10-year ASCVD risk score (Arnett DK, et al., 2019) is: 4.7%    Assessment & Plan:   Problem List Items Addressed This Visit       Cardiovascular and Mediastinum   Essential hypertension - Primary    Slightly elevated blood pressure during visit, likely due to stress. -Continue Triamterene HCTZ. -Check kidney function and electrolytes today.      Relevant Medications   triamterene-hydrochlorothiazide (MAXZIDE-25) 37.5-25 MG tablet   Other Relevant Orders   Basic Metabolic Panel (BMET)     Genitourinary   Stage 2 chronic kidney disease    Chronic and stable Recheck metabolic  panel Avoid nephrotoxic meds       Relevant Orders   Basic Metabolic Panel (BMET)     Other   Hyperlipidemia    Last cholesterol check was within normal limits. -Continue Crestor 5mg  daily. -Check cholesterol annually.      Relevant Medications   triamterene-hydrochlorothiazide (MAXZIDE-25) 37.5-25 MG tablet   Prediabetes    Reports increased water intake and consistent walking. Recent weight loss noted. -Check A1C today. -Continue current lifestyle modifications.      Relevant Orders   Hemoglobin A1c   Other Visit Diagnoses     Benign hypertension       Relevant Medications   triamterene-hydrochlorothiazide (MAXZIDE-25) 37.5-25 MG tablet   Immunization due       Relevant Orders   Flu vaccine trivalent PF, 6mos and  older(Flulaval,Afluria,Fluarix,Fluzone) (Completed)   Influenza vaccine needed       Relevant Orders   Flu vaccine trivalent PF, 6mos and older(Flulaval,Afluria,Fluarix,Fluzone) (Completed)           Caregiver Stress Reports significant stress and fatigue due to caring for mother. -Referral to social worker for assistance with resources and potential caregiver support. -Encourage self-care and stress management techniques.  Influenza Vaccination -Administer flu shot today.  General Health Maintenance -Schedule physical in six months. -Consider COVID booster shot at pharmacy.        Return in about 6 months (around 03/09/2024) for CPE.    Shirlee Latch, MD

## 2023-09-09 NOTE — Assessment & Plan Note (Signed)
Chronic and stable Recheck metabolic panel Avoid nephrotoxic meds

## 2023-09-09 NOTE — Assessment & Plan Note (Signed)
Slightly elevated blood pressure during visit, likely due to stress. -Continue Triamterene HCTZ. -Check kidney function and electrolytes today.

## 2023-09-09 NOTE — Assessment & Plan Note (Signed)
Last cholesterol check was within normal limits. -Continue Crestor 5mg  daily. -Check cholesterol annually.

## 2023-09-10 LAB — BASIC METABOLIC PANEL
BUN/Creatinine Ratio: 18 (ref 12–28)
BUN: 22 mg/dL (ref 8–27)
CO2: 26 mmol/L (ref 20–29)
Calcium: 10.2 mg/dL (ref 8.7–10.3)
Chloride: 103 mmol/L (ref 96–106)
Creatinine, Ser: 1.23 mg/dL — ABNORMAL HIGH (ref 0.57–1.00)
Glucose: 92 mg/dL (ref 70–99)
Potassium: 4.4 mmol/L (ref 3.5–5.2)
Sodium: 143 mmol/L (ref 134–144)
eGFR: 50 mL/min/{1.73_m2} — ABNORMAL LOW (ref >59)

## 2023-09-10 LAB — HEMOGLOBIN A1C
Est. average glucose Bld gHb Est-mCnc: 134 mg/dL
Hgb A1c MFr Bld: 6.3 % — ABNORMAL HIGH (ref 4.8–5.6)

## 2023-10-08 ENCOUNTER — Ambulatory Visit (INDEPENDENT_AMBULATORY_CARE_PROVIDER_SITE_OTHER): Payer: BC Managed Care – PPO | Admitting: Family Medicine

## 2023-10-08 ENCOUNTER — Encounter: Payer: Self-pay | Admitting: Family Medicine

## 2023-10-08 VITALS — BP 142/94 | HR 75 | Temp 97.9°F | Resp 16 | Ht 66.0 in | Wt 158.2 lb

## 2023-10-08 DIAGNOSIS — R0981 Nasal congestion: Secondary | ICD-10-CM | POA: Diagnosis not present

## 2023-10-08 DIAGNOSIS — J029 Acute pharyngitis, unspecified: Secondary | ICD-10-CM | POA: Diagnosis not present

## 2023-10-08 LAB — POC COVID19/FLU A&B COMBO
Covid Antigen, POC: NEGATIVE
Influenza A Antigen, POC: NEGATIVE
Influenza B Antigen, POC: NEGATIVE

## 2023-10-08 MED ORDER — AZELASTINE HCL 0.1 % NA SOLN
2.0000 | Freq: Two times a day (BID) | NASAL | 12 refills | Status: DC
Start: 2023-10-08 — End: 2024-08-28

## 2023-10-08 NOTE — Progress Notes (Signed)
Established patient visit   Patient: Caroline Bowen   DOB: [birth year]   62 y.o. Female  MRN: [id number] Visit Date: 10/08/2023  Today's healthcare provider: Ronnald Ramp, MD   Chief Complaint  Patient presents with   Sore Throat    Says throat was never painful but describes that it felt raw   Nasal Congestion    Left side blocked, allergy medicine has been helpful   Ear Fullness    Left side   Subjective     HPI     Sore Throat    Additional comments: Says throat was never painful but describes that it felt raw        Nasal Congestion    Additional comments: Left side blocked, allergy medicine has been helpful        Ear Fullness    Additional comments: Left side      Last edited by Ashok Cordia, CMA on 10/08/2023  9:12 AM.       Discussed the use of AI scribe software for clinical note transcription with the patient, who gave verbal consent to proceed.  History of Present Illness   The patient, a 62 year old female with a history of stage two chronic kidney disease, hyperlipidemia, and hypertension, presents with a chief complaint of sore throat and nasal congestion. The symptoms began approximately a week ago, initially manifesting as a raw sensation in the throat, without associated pain or scratchiness. The patient attempted self-treatment with over-the-counter remedies, including Emergen-C and Tylenol Severe Allergy, which provided minimal relief.  The patient's symptoms progressed during a work trip to [place], where she experienced significant nasal congestion, particularly on the left side. The patient also tried Zyrtec, which provided slight relief. The patient reports occasional coughing and describes the cough as sounding like a cold. The patient noticed a yellowish tint in her nasal discharge for the first time this morning, although previous discharge had been clear.  The patient also reports a clogged sensation in her right ear,  which began during a flight home. The patient describes a transient stinging sensation in the ear during the flight. The patient denies any fever, chills, body aches, or diarrhea. The patient also denies any chest congestion or shortness of breath. The patient has been maintaining her usual exercise routine of walking two miles every morning.  The patient's symptoms seem to be consistent with an allergic reaction, possibly exacerbated by changes in environment and air pressure during travel. The patient has a history of using a nasal spray for similar symptoms in the past, but did not recall having significant allergy symptoms in the last couple of years. The patient's symptoms have not improved with over-the-counter treatments and have progressively worsened over the past week.         Past Medical History:  Diagnosis Date   Anemia    Hypertension     Medications: Outpatient Medications Prior to Visit  Medication Sig   gabapentin (NEURONTIN) 300 MG capsule TAKE 1 CAPSULE BY MOUTH THREE TIMES A DAY (Patient taking differently: Take 300 mg by mouth at bedtime.)   rosuvastatin (CRESTOR) 5 MG tablet TAKE 1 TABLET (5 MG TOTAL) BY MOUTH DAILY.   triamterene-hydrochlorothiazide (MAXZIDE-25) 37.5-25 MG tablet Take 1 tablet by mouth daily.   vitamin C (ASCORBIC ACID) 500 MG tablet Take 500 mg by mouth daily.   No facility-administered medications prior to visit.    Review of Systems      Objective  BP (!) 142/94   Pulse 75   Temp 97.9 F (36.6 C)   Resp 16   Ht 5\' 6"  (1.676 m)   Wt 158 lb 3.2 oz (71.8 kg)   SpO2 100%   BMI 25.53 kg/m  BP Readings from Last 3 Encounters:  10/08/23 (!) 142/94  09/09/23 120/89  04/16/23 103/66   Wt Readings from Last 3 Encounters:  10/08/23 158 lb 3.2 oz (71.8 kg)  09/09/23 154 lb 14.4 oz (70.3 kg)  04/16/23 158 lb 12.8 oz (72 kg)       Results   LABS COVID-19: negative (10/06/2023) Influenza: negative (10/06/2023)     Physical Exam   Physical Exam   HEENT: Right tympanic membrane not erythematous, no purulent fluid, no maxillary or frontal sinus tenderness, oropharynx not erythematous, no tonsillar exudate, no erythematous uvula or uvular enlargement. Left tympanic membrane with cerumen impaction. CHEST: Lungs clear to auscultation, no wheezing or crackles. CARDIOVASCULAR: Heart sounds regular rate and rhythm, no murmurs.       Results for orders placed or performed in visit on 10/08/23  POC Covid19/Flu A&B Antigen  Result Value Ref Range   Influenza A Antigen, POC Negative Negative   Influenza B Antigen, POC Negative Negative   Covid Antigen, POC Negative Negative    Assessment & Plan     Problem List Items Addressed This Visit   None Visit Diagnoses     Congestion of nasal sinus    -  Primary   Relevant Medications   azelastine (ASTELIN) 0.1 % nasal spray   Other Relevant Orders   POC Covid19/Flu A&B Antigen (Completed)   Sore throat       Relevant Orders   POC Covid19/Flu A&B Antigen (Completed)          Allergic Rhinitis Symptoms consistent with allergic rhinitis, including nasal congestion, occasional coughing, and ear clogging, exacerbated by recent travel and environmental exposure. Physical exam: no erythema or exudate in oropharynx, no sinus tenderness, clear lungs, non-erythematous tympanic membrane, no purulent fluid. COVID-19 and influenza tests negative. Likely due to mucus production and congestion. - Prescribe Astelin nasal spray 0.1%, two sprays per nostril twice daily - Recommend over-the-counter Mucinex - Recommend Debrox drops for cerumen impaction - Advise increased water intake for decongestion - Instruct to monitor symptoms and call the office if no improvement by Monday - Seek medical attention if experiencing fever, severe headache, or ear pain  Cerumen Impaction Soft cerumen in ear canal without infection or significant blockage. Discussed Debrox drops for softening and  removal, and gentle cleaning with a washcloth corner. - Recommend Debrox drops - Advise gentle cleaning with a washcloth corner - Monitor for ear pain and seek medical attention if pain develops  General Health Maintenance Chronic conditions: chronic kidney disease stage two, hyperlipidemia, and hypertension. No changes discussed. - Continue current management for chronic kidney disease, hyperlipidemia, and hypertension  Follow-up - Call the office if symptoms worsen or no improvement by Monday - Seek medical attention if experiencing fever, severe headache, or ear pain.         Return in about 3 days (around 10/11/2023), or if symptoms worsen or fail to improve.         Ronnald Ramp, MD  [facility] [phone] (phone) [phone] (fax)  [facility]

## 2023-10-08 NOTE — Patient Instructions (Addendum)
VISIT SUMMARY:  During today's visit, we discussed your recent symptoms of sore throat and nasal congestion, which began about a week ago and have progressively worsened. You also reported a clogged sensation in your right ear, particularly after a flight. We reviewed your history of chronic kidney disease, hyperlipidemia, and hypertension, and noted that your current symptoms are likely due to allergic rhinitis, possibly exacerbated by recent travel and environmental changes.  YOUR PLAN:  -ALLERGIC RHINITIS: Allergic rhinitis is an inflammation of the nasal passages caused by allergens, leading to symptoms like nasal congestion, coughing, and ear clogging. We have prescribed Astelin nasal spray (two sprays per nostril twice daily) and recommended over-the-counter Mucinex to help with mucus production and congestion. Additionally, we suggest using Debrox drops for ear wax removal and increasing your water intake. Please monitor your symptoms and contact our office if there is no improvement by Monday. Seek immediate medical attention if you develop a fever, severe headache, or ear pain.  -CERUMEN IMPACTION: Cerumen impaction occurs when earwax builds up in the ear canal, which can cause a clogged sensation. We recommend using Debrox drops to soften and remove the earwax and gently cleaning your ear with a washcloth corner. Monitor for any ear pain and seek medical attention if pain develops.  -GENERAL HEALTH MAINTENANCE: We reviewed your chronic conditions, including chronic kidney disease, hyperlipidemia, and hypertension. Continue with your current management plan for these conditions. No changes were made during this visit.  INSTRUCTIONS:  Please call our office if your symptoms worsen or if there is no improvement by Monday. Seek immediate medical attention if you experience fever, severe headache, or ear pain.

## 2023-11-25 IMAGING — MG MM DIGITAL SCREENING BILAT W/ TOMO AND CAD
8 series · 8 of 24 positions shown · non-contrast
Comparison: Previous exam(s).

CLINICAL DATA: Screening.

EXAM:
DIGITAL SCREENING BILATERAL MAMMOGRAM WITH TOMOSYNTHESIS AND CAD
TECHNIQUE: Bilateral screening digital craniocaudal and mediolateral oblique
mammograms were obtained. Bilateral screening digital breast
tomosynthesis was performed. The images were evaluated with
computer-aided detection.

[R MLO synth-2D]
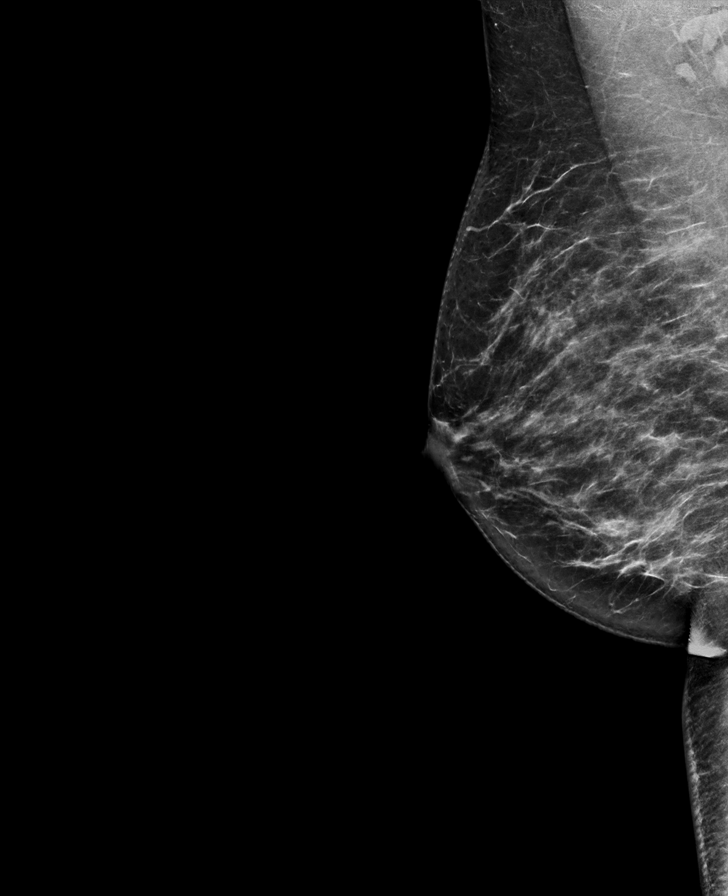

[R CC synth-2D]
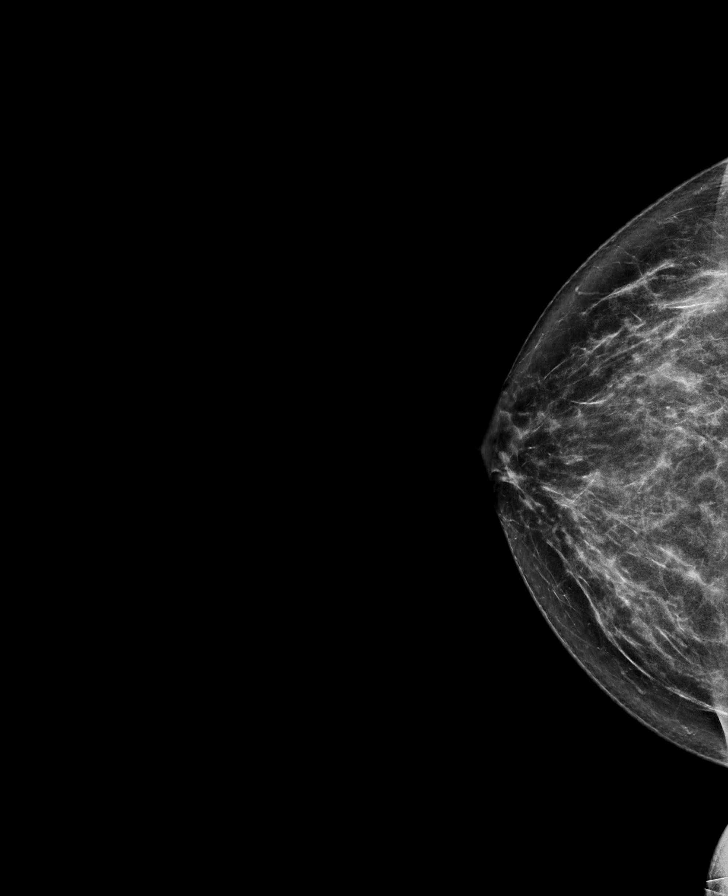

[L CC synth-2D]
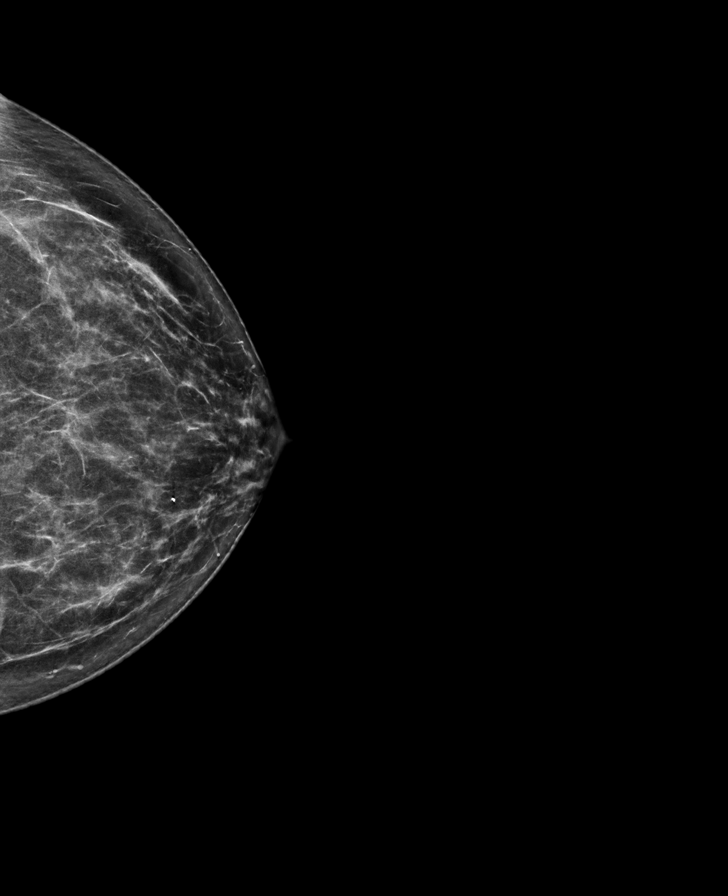

[L MLO synth-2D]
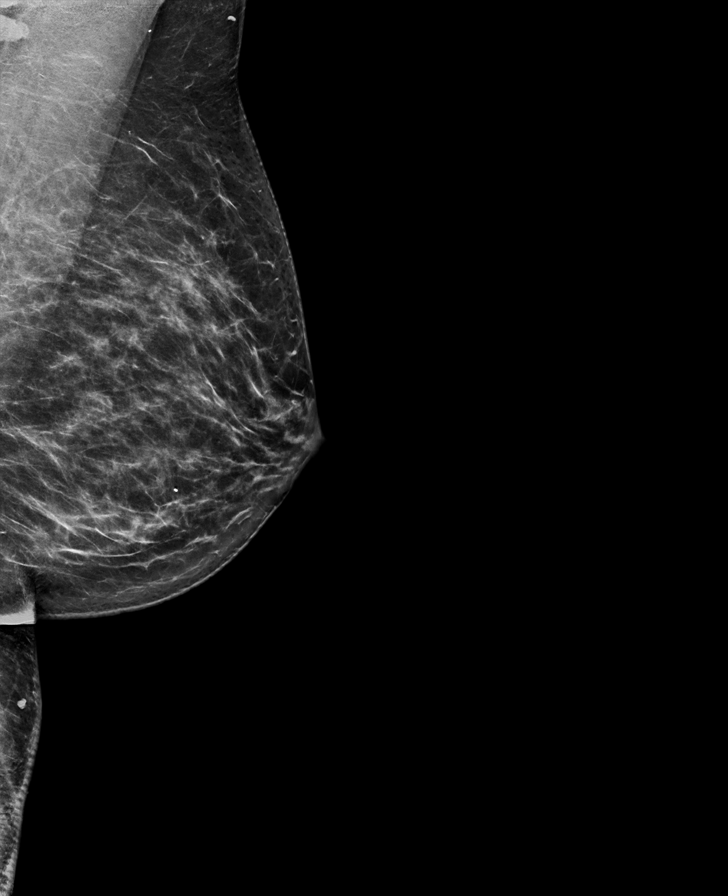

[R MLO tomo · tomo slice 39/76.0]
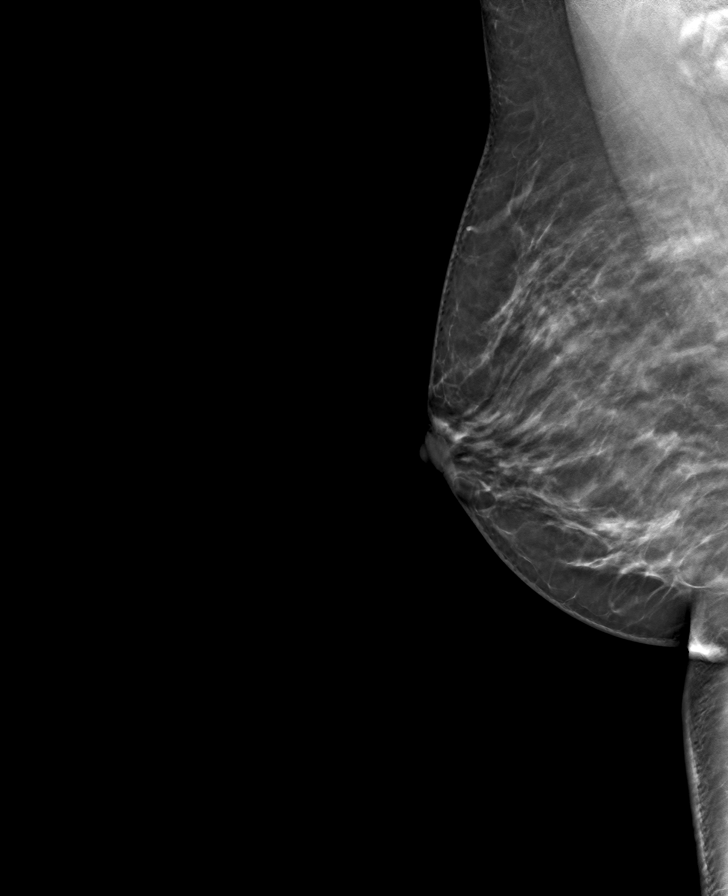

[L MLO tomo · tomo slice 38/75.0]
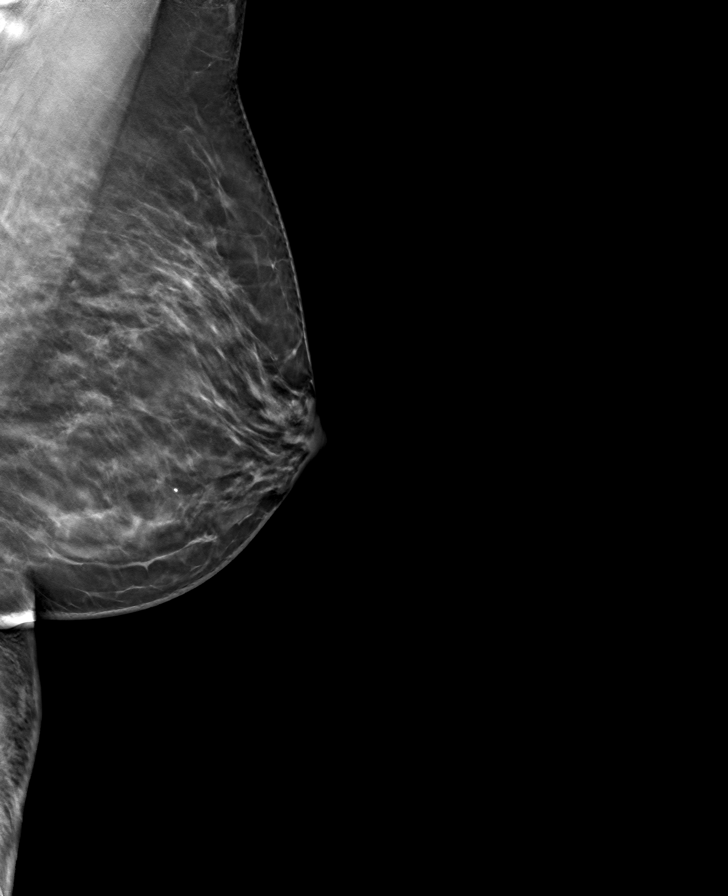

[R CC tomo · tomo slice 39/77.0]
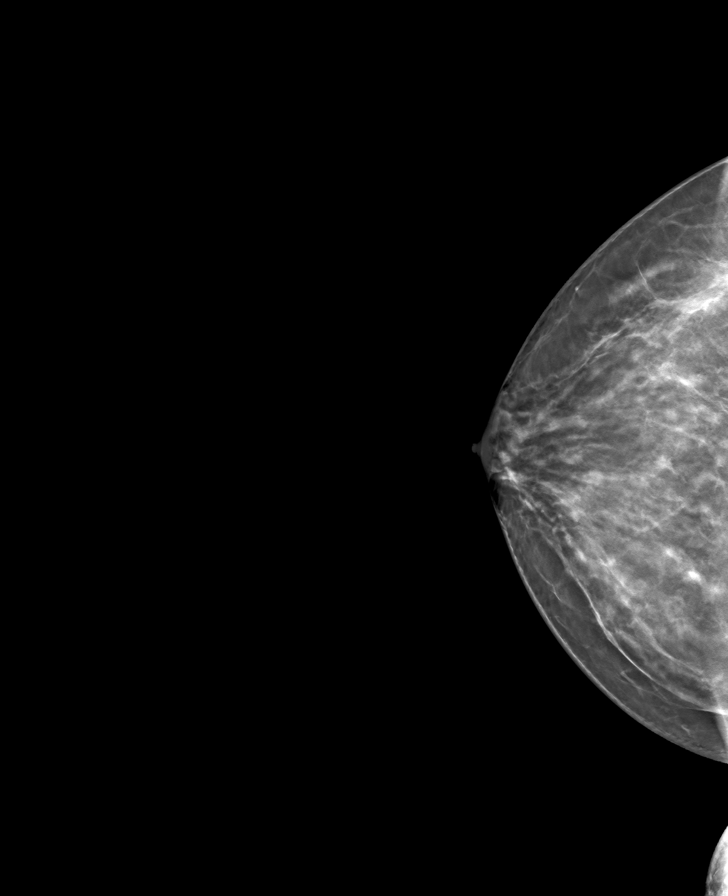

[L CC tomo · tomo slice 38/75.0]
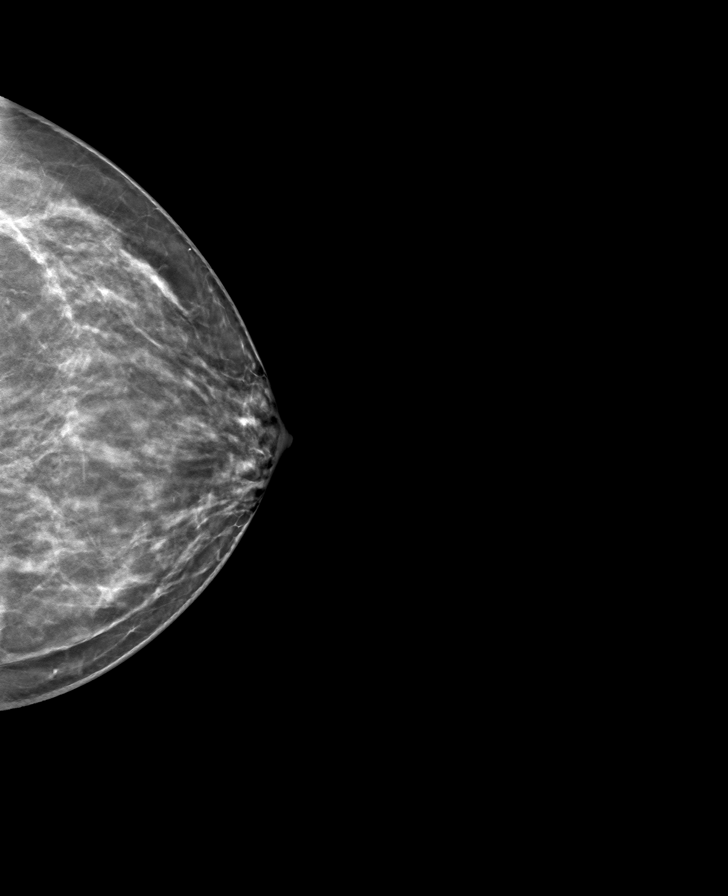

[8 of 24 positions shown; findings below may reference images not displayed]

ACR Breast Density Category c: The breast tissue is heterogeneously
dense, which may obscure small masses.
FINDINGS: There are no findings suspicious for malignancy.
IMPRESSION: No mammographic evidence of malignancy. A result letter of this
screening mammogram will be mailed directly to the patient.

RECOMMENDATION:
Screening mammogram in one year. (Code:Q3-W-BC3)

BI-RADS CATEGORY  1: Negative.

## 2023-12-17 ENCOUNTER — Other Ambulatory Visit: Payer: Self-pay | Admitting: Family Medicine

## 2023-12-17 DIAGNOSIS — Z1231 Encounter for screening mammogram for malignant neoplasm of breast: Secondary | ICD-10-CM

## 2024-01-18 ENCOUNTER — Ambulatory Visit
Admission: RE | Admit: 2024-01-18 | Discharge: 2024-01-18 | Disposition: A | Discharge status: Home / Self Care | Payer: BC Managed Care – PPO | Source: Ambulatory Visit | Attending: Family Medicine | Admitting: Family Medicine

## 2024-01-18 DIAGNOSIS — Z1231 Encounter for screening mammogram for malignant neoplasm of breast: Secondary | ICD-10-CM | POA: Diagnosis not present

## 2024-01-20 ENCOUNTER — Encounter: Payer: Self-pay | Admitting: Family Medicine

## 2024-03-14 ENCOUNTER — Ambulatory Visit: Payer: Self-pay | Admitting: Family Medicine

## 2024-03-14 ENCOUNTER — Encounter: Payer: Self-pay | Admitting: Family Medicine

## 2024-03-14 VITALS — BP 115/84 | HR 64 | Ht 64.0 in | Wt 156.8 lb

## 2024-03-14 DIAGNOSIS — R7303 Prediabetes: Secondary | ICD-10-CM | POA: Diagnosis not present

## 2024-03-14 DIAGNOSIS — Z0001 Encounter for general adult medical examination with abnormal findings: Secondary | ICD-10-CM | POA: Diagnosis not present

## 2024-03-14 DIAGNOSIS — E663 Overweight: Secondary | ICD-10-CM

## 2024-03-14 DIAGNOSIS — Z0000 Encounter for general adult medical examination without abnormal findings: Secondary | ICD-10-CM | POA: Diagnosis not present

## 2024-03-14 DIAGNOSIS — E782 Mixed hyperlipidemia: Secondary | ICD-10-CM | POA: Diagnosis not present

## 2024-03-14 DIAGNOSIS — Z862 Personal history of diseases of the blood and blood-forming organs and certain disorders involving the immune mechanism: Secondary | ICD-10-CM

## 2024-03-14 DIAGNOSIS — Z23 Encounter for immunization: Secondary | ICD-10-CM | POA: Diagnosis not present

## 2024-03-14 DIAGNOSIS — N182 Chronic kidney disease, stage 2 (mild): Secondary | ICD-10-CM

## 2024-03-14 DIAGNOSIS — I10 Essential (primary) hypertension: Secondary | ICD-10-CM

## 2024-03-14 NOTE — Assessment & Plan Note (Signed)
 Chronic Kidney Disease Stage 2. Monitoring kidney function as part of routine labs.

## 2024-03-14 NOTE — Assessment & Plan Note (Signed)
 Hypertension is well-controlled with current medication regimen.

## 2024-03-14 NOTE — Progress Notes (Signed)
 Complete physical exam   Patient: Caroline Bowen   DOB: Nov 18, 1961   63 y.o. Female  MRN: [id number] Visit Date: 03/14/2024  Today's healthcare provider: Aden Agreste, MD   Chief Complaint  Patient presents with   Annual Exam    Last completed 03/09/23 Diet -  General, well balanced Exercise - Walking at least a minimum of 2 miles at least 4 days a week taking at least 45-50 minutes Feeling - well Sleeping - well  Concerns -  none   Care Management    Pneumococcal Vaccine - yes Covid Booster? Should she receive as pharmacy    Subjective    Caroline Bowen is a 63 y.o. female who presents today for a complete physical exam.    Discussed the use of AI scribe software for clinical note transcription with the patient, who gave verbal consent to proceed.  History of Present Illness   A 63 year old patient with a history of hypertension, CKD stage 2, prediabetes, and hyperlipidemia presents for her annual physical. She reports no new symptoms or changes in her health status. She is currently on gabapentin  for menopausal hot flashes, Crestor  for hyperlipidemia, and Maxzide for hypertension. She has been compliant with her medications and has not reported any side effects. The patient has been dealing with personal stressors, including the loss of her mother and changes at her workplace. She is planning to retire in August and is looking forward to having more time for personal activities.        Last depression screening scores    03/14/2024    9:43 AM 10/08/2023    9:32 AM 09/09/2023    8:09 AM  PHQ 2/9 Scores  PHQ - 2 Score 1 0 0  PHQ- 9 Score  0 0   Last fall risk screening    03/14/2024    9:43 AM  Fall Risk   Falls in the past year? 0  Number falls in past yr: 0  Injury with Fall? 0  Risk for fall due to : No Fall Risks  Follow up Falls evaluation completed        Medications: Outpatient Medications Prior to Visit  Medication Sig   azelastine   (ASTELIN ) 0.1 % nasal spray Place 2 sprays into both nostrils 2 (two) times daily. Use in each nostril as directed   gabapentin  (NEURONTIN ) 300 MG capsule TAKE 1 CAPSULE BY MOUTH THREE TIMES A DAY (Patient taking differently: Take 300 mg by mouth at bedtime.)   rosuvastatin  (CRESTOR ) 5 MG tablet TAKE 1 TABLET (5 MG TOTAL) BY MOUTH DAILY.   triamterene -hydrochlorothiazide (MAXZIDE-25) 37.5-25 MG tablet Take 1 tablet by mouth daily.   vitamin C (ASCORBIC ACID) 500 MG tablet Take 500 mg by mouth daily.   No facility-administered medications prior to visit.    Review of Systems    Objective    BP 115/84 (BP Location: Right Arm, Patient Position: Sitting, Cuff Size: Normal)   Pulse 64   Ht 5\' 4"  (1.626 m)   Wt 156 lb 12.8 oz (71.1 kg)   SpO2 97%   BMI 26.91 kg/m    Physical Exam Vitals reviewed.  Constitutional:      General: She is not in acute distress.    Appearance: Normal appearance. She is well-developed. She is not diaphoretic.  HENT:     Head: Normocephalic and atraumatic.     Right Ear: Tympanic membrane, ear canal and external ear normal.     Left Ear:  Tympanic membrane, ear canal and external ear normal.     Nose: Nose normal.     Mouth/Throat:     Mouth: Mucous membranes are moist.     Pharynx: Oropharynx is clear. No oropharyngeal exudate.  Eyes:     General: No scleral icterus.    Conjunctiva/sclera: Conjunctivae normal.     Pupils: Pupils are equal, round, and reactive to light.  Neck:     Thyroid: No thyromegaly.  Cardiovascular:     Rate and Rhythm: Normal rate and regular rhythm.     Heart sounds: Normal heart sounds. No murmur heard. Pulmonary:     Effort: Pulmonary effort is normal. No respiratory distress.     Breath sounds: Normal breath sounds. No wheezing or rales.  Abdominal:     General: There is no distension.     Palpations: Abdomen is soft.     Tenderness: There is no abdominal tenderness.  Musculoskeletal:        General: No deformity.      Cervical back: Neck supple.     Right lower leg: No edema.     Left lower leg: No edema.  Lymphadenopathy:     Cervical: No cervical adenopathy.  Skin:    General: Skin is warm and dry.     Findings: No rash.  Neurological:     Mental Status: She is alert and oriented to person, place, and time. Mental status is at baseline.     Gait: Gait normal.  Psychiatric:        Mood and Affect: Mood normal.        Behavior: Behavior normal.        Thought Content: Thought content normal.      No results found for any visits on 03/14/24.  Assessment & Plan    Routine Health Maintenance and Physical Exam  Exercise Activities and Dietary recommendations  Goals      Exercise 150 minutes per week (moderate activity)        Immunization History  Administered Date(s) Administered   Influenza, Seasonal, Injecte, Preservative Fre 09/09/2023   Influenza,inj,Quad PF,6+ Mos 09/29/2013, 09/09/2015, 11/19/2016, 08/27/2017, 08/26/2018, 12/04/2019, 09/28/2020, 10/03/2021, 09/07/2022   PFIZER Comirnaty(Gray Top)Covid-19 Tri-Sucrose Vaccine 08/18/2022   PFIZER(Purple Top)SARS-COV-2 Vaccination 02/10/2020, 03/02/2020, 09/28/2020   PNEUMOCOCCAL CONJUGATE-20 03/14/2024   Pfizer Covid-19 Vaccine Bivalent Booster 52yrs & up 11/29/2021   Tdap 11/26/2017   Zoster Recombinant(Shingrix) 04/22/2021, 07/22/2021    Health Maintenance  Topic Date Due   COVID-19 Vaccine (6 - 2024-25 season) 07/25/2023   INFLUENZA VACCINE  06/23/2024   MAMMOGRAM  01/17/2026   Colonoscopy  04/15/2026   Cervical Cancer Screening (HPV/Pap Cotest)  02/06/2027   DTaP/Tdap/Td (2 - Td or Tdap) 11/27/2027   Hepatitis C Screening  Completed   HIV Screening  Completed   Zoster Vaccines- Shingrix  Completed   Pneumococcal Vaccine 62-63 Years old  Aged Out   HPV VACCINES  Aged Out   Meningococcal B Vaccine  Aged Out    Discussed health benefits of physical activity, and encouraged her to engage in regular exercise appropriate  for her age and condition.  Problem List Items Addressed This Visit       Cardiovascular and Mediastinum   Essential hypertension   Hypertension is well-controlled with current medication regimen.      Relevant Orders   Comprehensive metabolic panel with GFR     Genitourinary   Stage 2 chronic kidney disease   Chronic Kidney Disease Stage 2. Monitoring kidney  function as part of routine labs.      Relevant Orders   Comprehensive metabolic panel with GFR     Other   History of anemia   Relevant Orders   CBC w/Diff/Platelet   Hyperlipidemia   Relevant Orders   Comprehensive metabolic panel with GFR   Lipid panel   Overweight   Prediabetes   Prediabetes. Monitoring A1c as part of routine labs.       Relevant Orders   Hemoglobin A1c   Other Visit Diagnoses       Encounter for annual physical exam    -  Primary   Relevant Orders   Hemoglobin A1c   Comprehensive metabolic panel with GFR   Lipid panel   CBC w/Diff/Platelet     Immunization due       Relevant Orders   Pneumococcal conjugate vaccine 20-valent (Completed)     Need for pneumococcal vaccine       Relevant Orders   Pneumococcal conjugate vaccine 20-valent (Completed)           Wellness Visit Annual wellness visit. She is up to date on mammogram, Pap smear, colonoscopy, and shingles vaccination. Blood pressure is well-controlled. Discussed and administered pneumonia vaccine, recommended for those 50 and over. She plans to obtain the COVID-19 booster from the pharmacy. - Perform head to toe physical examination - Order labs including A1c, cholesterol, kidney and liver function, and blood counts - Administer pneumonia vaccine - Schedule six-month follow-up       Return in about 6 months (around 09/13/2024) for chronic disease f/u.     Caroline Agreste, MD  [facility] [phone] (phone) [phone] (fax)  [facility]

## 2024-03-14 NOTE — Assessment & Plan Note (Signed)
 Prediabetes. Monitoring A1c as part of routine labs.

## 2024-03-15 LAB — CBC WITH DIFFERENTIAL/PLATELET
Basophils Absolute: 0 10*3/uL (ref 0.0–0.2)
Basos: 0 %
EOS (ABSOLUTE): 0.1 10*3/uL (ref 0.0–0.4)
Eos: 2 %
Hematocrit: 34.5 % (ref 34.0–46.6)
Hemoglobin: 11.8 g/dL (ref 11.1–15.9)
Immature Grans (Abs): 0 10*3/uL (ref 0.0–0.1)
Immature Granulocytes: 0 %
Lymphocytes Absolute: 2 10*3/uL (ref 0.7–3.1)
Lymphs: 41 %
MCH: 32.1 pg (ref 26.6–33.0)
MCHC: 34.2 g/dL (ref 31.5–35.7)
MCV: 94 fL (ref 79–97)
Monocytes Absolute: 0.4 10*3/uL (ref 0.1–0.9)
Monocytes: 7 %
Neutrophils Absolute: 2.4 10*3/uL (ref 1.4–7.0)
Neutrophils: 50 %
Platelets: 183 10*3/uL (ref 150–450)
RBC: 3.68 x10E6/uL — ABNORMAL LOW (ref 3.77–5.28)
RDW: 11.9 % (ref 11.7–15.4)
WBC: 4.8 10*3/uL (ref 3.4–10.8)

## 2024-03-15 LAB — COMPREHENSIVE METABOLIC PANEL WITH GFR
ALT: 17 IU/L (ref 0–32)
AST: 21 IU/L (ref 0–40)
Albumin: 4.6 g/dL (ref 3.9–4.9)
Alkaline Phosphatase: 97 IU/L (ref 44–121)
BUN/Creatinine Ratio: 16 (ref 12–28)
BUN: 20 mg/dL (ref 8–27)
Bilirubin Total: 0.3 mg/dL (ref 0.0–1.2)
CO2: 25 mmol/L (ref 20–29)
Calcium: 10 mg/dL (ref 8.7–10.3)
Chloride: 104 mmol/L (ref 96–106)
Creatinine, Ser: 1.24 mg/dL — ABNORMAL HIGH (ref 0.57–1.00)
Globulin, Total: 2.8 g/dL (ref 1.5–4.5)
Glucose: 88 mg/dL (ref 70–99)
Potassium: 4.2 mmol/L (ref 3.5–5.2)
Sodium: 142 mmol/L (ref 134–144)
Total Protein: 7.4 g/dL (ref 6.0–8.5)
eGFR: 49 mL/min/{1.73_m2} — ABNORMAL LOW (ref >59)

## 2024-03-15 LAB — LIPID PANEL
Chol/HDL Ratio: 1.9 ratio (ref 0.0–4.4)
Cholesterol, Total: 154 mg/dL (ref 100–199)
HDL: 80 mg/dL (ref >39)
LDL Chol Calc (NIH): 63 mg/dL (ref 0–99)
Triglycerides: 51 mg/dL (ref 0–149)
VLDL Cholesterol Cal: 11 mg/dL (ref 5–40)

## 2024-03-15 LAB — HEMOGLOBIN A1C
Est. average glucose Bld gHb Est-mCnc: 128 mg/dL
Hgb A1c MFr Bld: 6.1 % — ABNORMAL HIGH (ref 4.8–5.6)

## 2024-03-16 ENCOUNTER — Encounter: Payer: Self-pay | Admitting: Family Medicine

## 2024-05-19 ENCOUNTER — Other Ambulatory Visit: Payer: Self-pay | Admitting: Family Medicine

## 2024-05-20 ENCOUNTER — Other Ambulatory Visit: Payer: Self-pay | Admitting: Family Medicine

## 2024-05-20 DIAGNOSIS — E782 Mixed hyperlipidemia: Secondary | ICD-10-CM

## 2024-05-22 NOTE — Telephone Encounter (Signed)
 Requested Prescriptions  Pending Prescriptions Disp Refills   rosuvastatin  (CRESTOR ) 5 MG tablet [Pharmacy Med Name: ROSUVASTATIN  CALCIUM  5 MG TAB] 90 tablet 3    Sig: TAKE 1 TABLET (5 MG TOTAL) BY MOUTH DAILY.     Cardiovascular:  Antilipid - Statins 2 Failed - 05/22/2024  4:15 PM      Failed - Cr in normal range and within 360 days    Creatinine  Date Value Ref Range Status  06/17/2014 1.22 0.60 - 1.30 mg/dL Final   Creatinine, Ser  Date Value Ref Range Status  03/14/2024 1.24 (H) 0.57 - 1.00 mg/dL Final         Failed - Lipid Panel in normal range within the last 12 months    Cholesterol, Total  Date Value Ref Range Status  03/14/2024 154 100 - 199 mg/dL Final   LDL Chol Calc (NIH)  Date Value Ref Range Status  03/14/2024 63 0 - 99 mg/dL Final   HDL  Date Value Ref Range Status  03/14/2024 80 >39 mg/dL Final   Triglycerides  Date Value Ref Range Status  03/14/2024 51 0 - 149 mg/dL Final         Passed - Patient is not pregnant      Passed - Valid encounter within last 12 months    Recent Outpatient Visits           2 months ago Encounter for annual physical exam   [facility] [place], Jon HERO, MD

## 2024-06-27 DIAGNOSIS — H52223 Regular astigmatism, bilateral: Secondary | ICD-10-CM | POA: Diagnosis not present

## 2024-06-27 DIAGNOSIS — H524 Presbyopia: Secondary | ICD-10-CM | POA: Diagnosis not present

## 2024-06-27 DIAGNOSIS — H2513 Age-related nuclear cataract, bilateral: Secondary | ICD-10-CM | POA: Diagnosis not present

## 2024-06-27 DIAGNOSIS — H5213 Myopia, bilateral: Secondary | ICD-10-CM | POA: Diagnosis not present

## 2024-07-18 ENCOUNTER — Ambulatory Visit: Payer: Self-pay | Admitting: Family Medicine

## 2024-07-18 ENCOUNTER — Telehealth: Payer: Self-pay | Admitting: Family Medicine

## 2024-07-18 NOTE — Telephone Encounter (Signed)
 FYI Only or Action Required?: FYI only for provider.  Patient was last seen in primary care on 03/14/2024 by Myrla Jon HERO, MD.  Called Nurse Triage reporting [facility].  Symptoms began several days ago.  Interventions attempted: OTC medications: Benadryl, cortisone.  Triage Disposition: See Physician Within 24 Hours  Patient/caregiver understands and will follow disposition?: Yes        Copied from CRM #[id number]. Topic: Clinical - Pink Word Triage >> Jul 18, 2024  9:09 AM Essie A wrote: Reason for Triage: Mowing last Friday, came into contact with either poison ivy or oak.  Tried Benedryl and cortisone cream but has not helped.  Mostly on arms and legs and very itchy.     ----------------------------------------------------------------------- From previous Reason for Contact - Scheduling: Patient/patient representative is calling to schedule an appointment. Refer to attachments for appointment information.       Reason for Disposition  MODERATE to SEVERE itching (e.g., interferes with work, school, sleep, or other activities)  Answer Assessment - Initial Assessment Questions Bumps on left and right arm (left arm has a bump with fluid in it) Bumps on legs 8 bumps total No rash Itching Denies pain Started last Fri  Protocols used: Poison Ivy - Oak - [place]-A-AH

## 2024-07-18 NOTE — Telephone Encounter (Unsigned)
 Copied from CRM #[id number]. Topic: Clinical - Pink Word Triage >> Jul 18, 2024  9:09 AM Essie A wrote: Reason for Triage: Mowing last Friday, came into contact with either poison ivy or oak.  Tried Benedryl and cortisone cream but has not helped.  Mostly on arms and legs and very itchy.   ----------------------------------------------------------------------- From previous Reason for Contact - Scheduling: Patient/patient representative is calling to schedule an appointment. Refer to attachments for appointment information.

## 2024-07-18 NOTE — Telephone Encounter (Signed)
 Noted

## 2024-07-19 ENCOUNTER — Encounter: Payer: Self-pay | Admitting: Physician Assistant

## 2024-07-19 ENCOUNTER — Ambulatory Visit: Admitting: Physician Assistant

## 2024-07-19 VITALS — BP 152/78 | HR 65 | Temp 97.9°F | Ht 64.0 in | Wt 157.5 lb

## 2024-07-19 DIAGNOSIS — L299 Pruritus, unspecified: Secondary | ICD-10-CM

## 2024-07-19 DIAGNOSIS — L255 Unspecified contact dermatitis due to plants, except food: Secondary | ICD-10-CM

## 2024-07-19 DIAGNOSIS — R21 Rash and other nonspecific skin eruption: Secondary | ICD-10-CM

## 2024-07-19 DIAGNOSIS — R7303 Prediabetes: Secondary | ICD-10-CM | POA: Diagnosis not present

## 2024-07-19 DIAGNOSIS — L258 Unspecified contact dermatitis due to other agents: Secondary | ICD-10-CM

## 2024-07-19 MED ORDER — HYDROXYZINE PAMOATE 25 MG PO CAPS
25.0000 mg | ORAL_CAPSULE | Freq: Three times a day (TID) | ORAL | 0 refills | Status: DC | PRN
Start: 2024-07-19 — End: 2024-08-28

## 2024-07-19 MED ORDER — LEVOCETIRIZINE DIHYDROCHLORIDE 2.5 MG/5ML PO SOLN
5.0000 mg | Freq: Every evening | ORAL | 0 refills | Status: DC
Start: 2024-07-19 — End: 2024-08-28

## 2024-07-19 MED ORDER — TRIAMCINOLONE ACETONIDE 0.1 % EX CREA
1.0000 | TOPICAL_CREAM | Freq: Two times a day (BID) | CUTANEOUS | 0 refills | Status: DC
Start: 2024-07-19 — End: 2024-08-28

## 2024-07-19 NOTE — Progress Notes (Signed)
 " Established patient visit  Patient: Caroline Bowen   DOB: Dec 11, 1960   63 y.o. Female  MRN: [id number] Visit Date: 07/19/2024  Today's healthcare provider: Jolynn Spencer, PA-C   Chief Complaint  Patient presents with   [facility]    Patient presents with rash from poison ivy. Patient states that she was doing yard work Friday at her mothers house, and has been having itching ever since. Reports bumps that itch on both lower arms, back of both legs, side of right knee is the only area that is bothering her today. Pt says on most places the itching has improved except the right knee. Was using benadryl and hydrocortisone cream with no relief    Subjective      Discussed the use of AI scribe software for clinical note transcription with the patient, who gave verbal consent to proceed.  History of Present Illness Caroline Bowen is a 63 year old female who presents with a rash and itching.  She has a history of poison ivy exposure with similar symptoms, though it has been several years since her last episode. The current rash presents as patches and is dry, unlike previous oozing episodes. Yesterday, the rash was intensely pruritic, especially in the shower, but today the pruritus has decreased. A new spot with a clear fluid-filled appearance is noted. The pruritus is not accompanied by pain.  She uses over-the-counter hydrocortisone cream for temporary relief and takes Benadryl at night, which aids sleep but does not alleviate the pruritus. She seeks an internal medication as topical treatments seem insufficient.  Her past medical history includes prediabetes, and she is cautious about medications that could affect her blood sugar levels.       07/19/2024    1:35 PM 03/14/2024    9:43 AM 10/08/2023    9:32 AM  Depression screen PHQ 2/9  Decreased Interest 0 0 0  Down, Depressed, Hopeless 0 1 0  PHQ - 2 Score 0 1 0  Altered sleeping 0  0  Tired, decreased energy 0  0  Change in  appetite 0  0  Feeling bad or failure about yourself  0  0  Trouble concentrating 0  0  Moving slowly or fidgety/restless 0  0  Suicidal thoughts 0  0  PHQ-9 Score 0  0  Difficult doing work/chores Not difficult at all        07/19/2024    1:35 PM 03/14/2024    9:43 AM 10/08/2023    9:32 AM 09/09/2023    8:09 AM  GAD 7 : Generalized Anxiety Score  Nervous, Anxious, on Edge 0 0 0 1  Control/stop worrying 0 0 0 0  Worry too much - different things 0 0 0 0  Trouble relaxing 0 0 1 0  Restless 0 0 0 0  Easily annoyed or irritable 0 0 0 0  Afraid - awful might happen 0 0 1 0  Total GAD 7 Score 0 0 2 1  Anxiety Difficulty Not difficult at all Not difficult at all Not difficult at all Not difficult at all    Medications: Outpatient Medications Prior to Visit  Medication Sig   azelastine  (ASTELIN ) 0.1 % nasal spray Place 2 sprays into both nostrils 2 (two) times daily. Use in each nostril as directed   gabapentin  (NEURONTIN ) 300 MG capsule TAKE 1 CAPSULE BY MOUTH THREE TIMES A DAY   rosuvastatin  (CRESTOR ) 5 MG tablet TAKE 1 TABLET (5 MG TOTAL) BY MOUTH DAILY.  triamterene -hydrochlorothiazide (MAXZIDE-25) 37.5-25 MG tablet Take 1 tablet by mouth daily.   vitamin C (ASCORBIC ACID) 500 MG tablet Take 500 mg by mouth daily.   No facility-administered medications prior to visit.    Review of Systems All negative Except see HPI       Objective    BP (!) 152/78 (BP Location: Right Arm, Patient Position: Sitting, Cuff Size: Normal) Comment: manual  Pulse 65   Temp 97.9 F (36.6 C) (Oral)   Ht 5' 4 (1.626 m)   Wt 157 lb 8 oz (71.4 kg)   SpO2 100%   BMI 27.03 kg/m     Physical Exam Constitutional:      General: She is not in acute distress.    Appearance: Normal appearance.  HENT:     Head: Normocephalic.  Pulmonary:     Effort: Pulmonary effort is normal. No respiratory distress.  Skin:    Findings: Rash present.  Neurological:     Mental Status: She is alert and  oriented to person, place, and time. Mental status is at baseline.      No results found for any visits on 07/19/24.      Assessment & Plan Rhus dermatitis/Allergic contact dermatitis due to plants (suspected poison ivy) Recurrent pruritic rash, likely from poison ivy exposure, subsiding with some areas drying. Prednisone  avoided due to potential hyperglycemia from prediabetes. - Prescribed Xyzal  (levocetirizine) solution, 2.5 mg twice daily. - Prescribed Atarax  (hydroxyzine ) 25 mg capsule at bedtime for pruritus. - Prescribed stronger steroid cream for topical application. - Advised use of cool compresses for symptomatic relief.  Prediabetes Prediabetes influences management of allergic contact dermatitis due to hyperglycemia risk with systemic steroids.   Rash  - levocetirizine (XYZAL ) 2.5 MG/5ML solution; Take 10 mLs (5 mg total) by mouth every evening.  Dispense: 118 mL; Refill: 0 - hydrOXYzine  (VISTARIL ) 25 MG capsule; Take 1 capsule (25 mg total) by mouth every 8 (eight) hours as needed.  Dispense: 30 capsule; Refill: 0 - triamcinolone  cream (KENALOG ) 0.1 %; Apply 1 Application topically 2 (two) times daily.  Dispense: 30 g; Refill: 0  Pruritus  - levocetirizine (XYZAL ) 2.5 MG/5ML solution; Take 10 mLs (5 mg total) by mouth every evening.  Dispense: 118 mL; Refill: 0 - hydrOXYzine  (VISTARIL ) 25 MG capsule; Take 1 capsule (25 mg total) by mouth every 8 (eight) hours as needed.  Dispense: 30 capsule; Refill: 0  Prediabetes Chronic Last A1c 6.1 on 4.22.25 Low carb and daily exercise advised Will follow-up  No orders of the defined types were placed in this encounter.   No follow-ups on file.   The patient was advised to call back or seek an in-person evaluation if the symptoms worsen or if the condition fails to improve as anticipated.  I discussed the assessment and treatment plan with the patient. The patient was provided an opportunity to ask questions and all were  answered. The patient agreed with the plan and demonstrated an understanding of the instructions.  I, Holten Spano, PA-C have reviewed all documentation for this visit. The documentation on 07/19/2024  for the exam, diagnosis, procedures, and orders are all accurate and complete.  Jolynn Spencer, [facility], MMS [facility] [phone] (phone) [phone] (fax)  [facility] "

## 2024-08-15 DIAGNOSIS — H2511 Age-related nuclear cataract, right eye: Secondary | ICD-10-CM | POA: Diagnosis not present

## 2024-08-15 DIAGNOSIS — H2512 Age-related nuclear cataract, left eye: Secondary | ICD-10-CM | POA: Diagnosis not present

## 2024-08-15 DIAGNOSIS — H2513 Age-related nuclear cataract, bilateral: Secondary | ICD-10-CM | POA: Diagnosis not present

## 2024-08-24 NOTE — Anesthesia Preprocedure Evaluation (Addendum)
 Anesthesia Evaluation  Patient identified by MRN, date of birth, ID band Patient awake    Reviewed: Allergy & Precautions, H&P , NPO status , Patient's Chart, lab work & pertinent test results  Airway Mallampati: II  TM Distance: >3 FB Neck ROM: Full  Mouth opening: Limited Mouth Opening Comment: Would likely have a good airway, but has rubber bands and braces, and can't open mouth very much Dental no notable dental hx.  Rubber bands, braces top and bottom :   Pulmonary neg pulmonary ROS   Pulmonary exam normal breath sounds clear to auscultation       Cardiovascular hypertension, Normal cardiovascular exam Rhythm:Regular Rate:Normal     Neuro/Psych  Headaches negative neurological ROS  negative psych ROS   GI/Hepatic negative GI ROS, Neg liver ROS,,,  Endo/Other  negative endocrine ROS    Renal/GU Renal diseasenegative Renal ROS  negative genitourinary   Musculoskeletal negative musculoskeletal ROS (+)    Abdominal   Peds negative pediatric ROS (+)  Hematology negative hematology ROS (+) Blood dyscrasia, anemia   Anesthesia Other Findings Anemia HTN  Lovely lady here with her sister, asked for prayer and we prayed.    Reproductive/Obstetrics negative OB ROS                              Anesthesia Physical Anesthesia Plan  ASA: 2  Anesthesia Plan: MAC   Post-op Pain Management:    Induction: Intravenous  PONV Risk Score and Plan:   Airway Management Planned: Natural Airway and Nasal Cannula  Additional Equipment:   Intra-op Plan:   Post-operative Plan:   Informed Consent: I have reviewed the patients History and Physical, chart, labs and discussed the procedure including the risks, benefits and alternatives for the proposed anesthesia with the patient or authorized representative who has indicated his/her understanding and acceptance.     Dental Advisory Given  Plan  Discussed with: Anesthesiologist, CRNA and Surgeon  Anesthesia Plan Comments: (Patient consented for risks of anesthesia including but not limited to:  - adverse reactions to medications - damage to eyes, teeth, lips or other oral mucosa - nerve damage due to positioning  - sore throat or hoarseness - Damage to heart, brain, nerves, lungs, other parts of body or loss of life  Patient voiced understanding and assent.)         Anesthesia Quick Evaluation

## 2024-08-28 ENCOUNTER — Encounter: Payer: Self-pay | Admitting: Ophthalmology

## 2024-08-29 NOTE — Discharge Instructions (Signed)

## 2024-08-30 ENCOUNTER — Ambulatory Visit: Payer: Self-pay | Admitting: Anesthesiology

## 2024-08-30 ENCOUNTER — Encounter: Payer: Self-pay | Admitting: Ophthalmology

## 2024-08-30 ENCOUNTER — Other Ambulatory Visit: Payer: Self-pay

## 2024-08-30 ENCOUNTER — Encounter
Admission: RE | Disposition: A | Discharge status: Home / Self Care | Payer: Self-pay | Source: Home / Self Care | Attending: Ophthalmology

## 2024-08-30 ENCOUNTER — Ambulatory Visit
Admission: RE | Admit: 2024-08-30 | Discharge: 2024-08-30 | Disposition: A | Discharge status: Home / Self Care | Attending: Ophthalmology | Admitting: Ophthalmology

## 2024-08-30 DIAGNOSIS — I10 Essential (primary) hypertension: Secondary | ICD-10-CM | POA: Insufficient documentation

## 2024-08-30 DIAGNOSIS — H2512 Age-related nuclear cataract, left eye: Secondary | ICD-10-CM | POA: Diagnosis not present

## 2024-08-30 DIAGNOSIS — N182 Chronic kidney disease, stage 2 (mild): Secondary | ICD-10-CM | POA: Diagnosis not present

## 2024-08-30 DIAGNOSIS — H2511 Age-related nuclear cataract, right eye: Secondary | ICD-10-CM | POA: Diagnosis not present

## 2024-08-30 DIAGNOSIS — I129 Hypertensive chronic kidney disease with stage 1 through stage 4 chronic kidney disease, or unspecified chronic kidney disease: Secondary | ICD-10-CM | POA: Diagnosis not present

## 2024-08-30 DIAGNOSIS — D631 Anemia in chronic kidney disease: Secondary | ICD-10-CM | POA: Diagnosis not present

## 2024-08-30 HISTORY — DX: Prediabetes: R73.03

## 2024-08-30 SURGERY — PHACOEMULSIFICATION, CATARACT, WITH IOL INSERTION
Anesthesia: Monitor Anesthesia Care | Laterality: Left

## 2024-08-30 MED ORDER — SIGHTPATH DOSE#1 BSS IO SOLN
INTRAOCULAR | Status: DC | PRN
  Administered 2024-08-30: 60 mL via OPHTHALMIC

## 2024-08-30 MED ORDER — ARMC OPHTHALMIC DILATING DROPS
1.0000 | OPHTHALMIC | Status: DC | PRN
  Administered 2024-08-30 (×3): 1 via OPHTHALMIC

## 2024-08-30 MED ORDER — FENTANYL CITRATE (PF) 100 MCG/2ML IJ SOLN
INTRAMUSCULAR | Status: AC
  Filled 2024-08-30: qty 2

## 2024-08-30 MED ORDER — LIDOCAINE HCL (PF) 2 % IJ SOLN
INTRAOCULAR | Status: DC | PRN
  Administered 2024-08-30: 2 mL

## 2024-08-30 MED ORDER — SIGHTPATH DOSE#1 NA HYALUR & NA CHOND-NA HYALUR IO KIT
PACK | INTRAOCULAR | Status: DC | PRN
  Administered 2024-08-30: 1 via OPHTHALMIC

## 2024-08-30 MED ORDER — TETRACAINE HCL 0.5 % OP SOLN
1.0000 [drp] | OPHTHALMIC | Status: DC | PRN
  Administered 2024-08-30 (×3): 1 [drp] via OPHTHALMIC

## 2024-08-30 MED ORDER — MIDAZOLAM HCL 2 MG/2ML IJ SOLN
INTRAMUSCULAR | Status: DC | PRN
  Administered 2024-08-30: 2 mg via INTRAVENOUS

## 2024-08-30 MED ORDER — TETRACAINE HCL 0.5 % OP SOLN
OPHTHALMIC | Status: AC
  Filled 2024-08-30: qty 4

## 2024-08-30 MED ORDER — CEFUROXIME OPHTHALMIC INJECTION 1 MG/0.1 ML
INJECTION | OPHTHALMIC | Status: DC | PRN
  Administered 2024-08-30: .1 mL via INTRACAMERAL

## 2024-08-30 MED ORDER — MIDAZOLAM HCL 2 MG/2ML IJ SOLN
INTRAMUSCULAR | Status: AC
  Filled 2024-08-30: qty 2

## 2024-08-30 MED ORDER — BRIMONIDINE TARTRATE-TIMOLOL 0.2-0.5 % OP SOLN
OPHTHALMIC | Status: DC | PRN
  Administered 2024-08-30: 1 [drp] via OPHTHALMIC

## 2024-08-30 MED ORDER — ARMC OPHTHALMIC DILATING DROPS
OPHTHALMIC | Status: AC
  Filled 2024-08-30: qty 0.5

## 2024-08-30 MED ORDER — FENTANYL CITRATE (PF) 100 MCG/2ML IJ SOLN
INTRAMUSCULAR | Status: DC | PRN
  Administered 2024-08-30: 25 ug via INTRAVENOUS

## 2024-08-30 MED ORDER — SIGHTPATH DOSE#1 BSS IO SOLN
INTRAOCULAR | Status: DC | PRN
  Administered 2024-08-30: 15 mL via INTRAOCULAR

## 2024-08-30 MED ORDER — LACTATED RINGERS IV SOLN: INTRAVENOUS | Status: DC

## 2024-08-30 SURGICAL SUPPLY — 9 items
FEE CATARACT SUITE SIGHTPATH (MISCELLANEOUS) ×1 IMPLANT
GLOVE BIOGEL PI IND STRL 8 (GLOVE) ×1 IMPLANT
GLOVE SURG LX STRL 7.5 STRW (GLOVE) ×1 IMPLANT
GLOVE SURG SYN 6.5 PF PI BL (GLOVE) ×1 IMPLANT
LENS IOL CLRN PAN PRO TRC 19.0 ×1 IMPLANT
LENS IOL CLRN PANO TRC 3 19.0 IMPLANT
NDL FILTER BLUNT 18X1 1/2 (NEEDLE) ×1 IMPLANT
NEEDLE FILTER BLUNT 18X1 1/2 (NEEDLE) ×1 IMPLANT
SYR 3ML LL SCALE MARK (SYRINGE) ×1 IMPLANT

## 2024-08-30 NOTE — Op Note (Signed)
 LOCATION:  [facility]   PREOPERATIVE DIAGNOSIS:  Nuclear sclerotic cataract of the left eye.  H25.12  POSTOPERATIVE DIAGNOSIS:  Nuclear sclerotic cataract of the left eye.   PROCEDURE:  Phacoemulsification with Toric posterior chamber intraocular lens placement of the left eye.  Ultrasound time: Procedure(s): PHACOEMULSIFICATION, CATARACT, WITH IOL INSERTION 4.02, 00:41.0 (Left)  LENS:   Implant Name Type Inv. Item Serial No. Manufacturer Lot No. LRB No. Used Action  LENS IOL CLRN PAN PRO TRC 19.0 - [id number]  LENS IOL CLRN PAN PRO TRC 19.0 [id number] [employer]  Left 1 Implanted     [id number] PanoptixToric intraocular lens with 1.5 diopters of cylindrical power with axis orientation at 84 degrees.     SURGEON:  Dene FABIENE Etienne, MD   ANESTHESIA:  Topical with tetracaine drops and 2% Xylocaine jelly, augmented with 1% preservative-free intracameral lidocaine.  COMPLICATIONS:  None.   DESCRIPTION OF PROCEDURE:  The patient was identified in the holding room and transported to the operating suite and placed in the supine position under the operating microscope.  The left eye was identified as the operative eye, and it was prepped and draped in the usual sterile ophthalmic fashion.    A clear-corneal paracentesis incision was made at the 1:30 position.  0.5 ml of preservative-free 1% lidocaine was injected into the anterior chamber. The anterior chamber was filled with Viscoat.  A 2.4 millimeter near clear corneal incision was then made at the 10:30 position.  A cystotome and capsulorrhexis forceps were then used to make a curvilinear capsulorrhexis.  Hydrodissection and hydrodelineation were then performed using balanced salt solution.   Phacoemulsification was then used in stop and chop fashion to remove the lens, nucleus and epinucleus.  The remaining cortex was aspirated using the irrigation and aspiration handpiece.  Provisc viscoelastic was then placed into the capsular  bag to distend it for lens placement.  The Verion digital marker was used to align the implant at the intended axis.   A Toric lens was then injected into the capsular bag.  It was rotated clockwise until the axis marks on the lens were approximately 15 degrees in the counterclockwise direction to the intended alignment.  The viscoelastic was aspirated from the eye using the irrigation aspiration handpiece.  Then, a Koch spatula through the sideport incision was used to rotate the lens in a clockwise direction until the axis markings of the intraocular lens were lined up with the Verion alignment.  Balanced salt solution was then used to hydrate the wounds. Cefuroxime 0.1 ml of a 10mg /ml solution was injected into the anterior chamber for a dose of 1 mg of intracameral antibiotic at the completion of the case.    The eye was noted to have a physiologic pressure and there was no wound leak noted.   Timolol and Brimonidine drops were applied to the eye.  The patient was taken to the recovery room in stable condition having had no complications of anesthesia or surgery.  Simra Fiebig 08/30/2024, 11:41 AM

## 2024-08-30 NOTE — H&P (Signed)
 [facility]   Primary Care Physician:  Myrla Jon HERO, MD Ophthalmologist: Dr. Dene Etienne  Pre-Procedure History & Physical: HPI:  Caroline Bowen is a 63 y.o. female here for ophthalmic surgery.   Past Medical History:  Diagnosis Date   Anemia    in the past   Hypertension    Pre-diabetes     Past Surgical History:  Procedure Laterality Date   COLONOSCOPY WITH PROPOFOL  N/A 04/16/2023   Procedure: COLONOSCOPY WITH PROPOFOL ;  Surgeon: Unk Corinn Skiff, MD;  Location: [facility];  Service: Gastroenterology;  Laterality: N/A;   CYST EXCISION Right 2001   wrist   wrist surgery  2001    Prior to Admission medications   Medication Sig Start Date End Date Taking? Authorizing Provider  gabapentin  (NEURONTIN ) 300 MG capsule TAKE 1 CAPSULE BY MOUTH THREE TIMES A DAY 05/19/24  Yes Clifton, Curtis A, FNP  rosuvastatin  (CRESTOR ) 5 MG tablet TAKE 1 TABLET (5 MG TOTAL) BY MOUTH DAILY. 05/22/24  Yes Bacigalupo, Jon HERO, MD  triamterene -hydrochlorothiazide (MAXZIDE-25) 37.5-25 MG tablet Take 1 tablet by mouth daily. 09/09/23  Yes Bacigalupo, Angela M, MD  vitamin C (ASCORBIC ACID) 500 MG tablet Take 500 mg by mouth daily.   Yes [provider]    Allergies as of 08/02/2024 - Review Complete 07/19/2024  Allergen Reaction Noted   Metoprolol Swelling 05/24/2015    Family History  Problem Relation Age of Onset   Hypertension Mother    Cancer Mother        ovarian   Ovarian cancer Mother    Hypertension Father    Diabetes Father    Kidney disease Father    Prostate cancer Father    Heart failure Father    Diabetes Sister    Hypertension Sister    Hypertension Brother    Stroke Brother    Hypertension Sister    Pancreatic cancer Brother    Hypertension Brother    Breast cancer Neg Hx    Colon cancer Neg Hx     Social History   Socioeconomic History   Marital status: Married    Spouse name: Marolyn   Number of children: 1   Years of  education: 16   Highest education level: Bachelor's degree (e.g., BA, AB, BS)  Occupational History   Not on file  Tobacco Use   Smoking status: Never   Smokeless tobacco: Never  Vaping Use   Vaping status: Never Used  Substance and Sexual Activity   Alcohol use: Yes    Comment: very rare 3 x year   Drug use: Not on file    Comment: in 20's   Sexual activity: Yes    Birth control/protection: Post-menopausal  Other Topics Concern   Not on file  Social History Narrative   Not on file   Social Drivers of Health   Financial Resource Strain: Low Risk  (11/26/2017)   Overall Financial Resource Strain (CARDIA)    Difficulty of Paying Living Expenses: Not hard at all  Food Insecurity: No Food Insecurity (11/26/2017)   Hunger Vital Sign    Worried About Running Out of Food in the Last Year: Never true    Ran Out of Food in the Last Year: Never true  Transportation Needs: No Transportation Needs (11/26/2017)   PRAPARE - Administrator, Civil Service (Medical): No    Lack of Transportation (Non-Medical): No  Physical Activity: Insufficiently Active (11/26/2017)   Exercise Vital Sign    Days  of Exercise per Week: 3 days    Minutes of Exercise per Session: 20 min  Stress: Not on file  Social Connections: Not on file  Intimate Partner Violence: Not on file    Review of Systems: See HPI, otherwise negative ROS  Physical Exam: BP (!) 147/94   Temp 97.7 F (36.5 C) (Temporal)   Resp 13   Ht 5' 5.98 (1.676 m)   Wt 71.4 kg   SpO2 97%   BMI 25.43 kg/m  General:   Alert,  pleasant and cooperative in NAD Head:  Normocephalic and atraumatic. Lungs:  Clear to auscultation.    Heart:  Regular rate and rhythm.   Impression/Plan: Caroline Bowen is here for ophthalmic surgery.  Risks, benefits, limitations, and alternatives regarding ophthalmic surgery have been reviewed with the patient.  Questions have been answered.  All parties agreeable.   MITTIE GASKIN, MD   08/30/2024, 10:42 AM

## 2024-08-30 NOTE — Transfer of Care (Signed)
 Immediate Anesthesia Transfer of Care Note  Patient: Caroline Bowen  Procedure(s) Performed: PHACOEMULSIFICATION, CATARACT, WITH IOL INSERTION 4.02, 00:41.0 (Left)  Patient Location: PACU  Anesthesia Type: MAC  Level of Consciousness: awake, alert  and patient cooperative  Airway and Oxygen Therapy: Patient Spontanous Breathing and Patient connected to supplemental oxygen  Post-op Assessment: Post-op Vital signs reviewed, Patient's Cardiovascular Status Stable, Respiratory Function Stable, Patent Airway and No signs of Nausea or vomiting  Post-op Vital Signs: Reviewed and stable  Complications: No notable events documented.

## 2024-08-30 NOTE — Anesthesia Postprocedure Evaluation (Signed)
 Anesthesia Post Note  Patient: Lawrnce KATHEE Dixons  Procedure(s) Performed: PHACOEMULSIFICATION, CATARACT, WITH IOL INSERTION 4.02, 00:41.0 (Left)  Patient location during evaluation: PACU Anesthesia Type: MAC Level of consciousness: awake and alert Pain management: pain level controlled Vital Signs Assessment: post-procedure vital signs reviewed and stable Respiratory status: spontaneous breathing, nonlabored ventilation, respiratory function stable and patient connected to nasal cannula oxygen Cardiovascular status: stable and blood pressure returned to baseline Postop Assessment: no apparent nausea or vomiting Anesthetic complications: no   No notable events documented.   Last Vitals:  Vitals:   08/30/24 1012  BP: (!) 147/94  Resp: 13  Temp: 36.5 C  SpO2: 97%    Last Pain:  Vitals:   08/30/24 1012  TempSrc: Temporal  PainSc: 0-No pain                 Juline Sanderford C Cary Lothrop

## 2024-08-31 ENCOUNTER — Encounter: Payer: Self-pay | Admitting: Ophthalmology

## 2024-08-31 DIAGNOSIS — H2511 Age-related nuclear cataract, right eye: Secondary | ICD-10-CM | POA: Diagnosis not present

## 2024-09-11 NOTE — Discharge Instructions (Signed)

## 2024-09-13 ENCOUNTER — Encounter
Admission: RE | Disposition: A | Discharge status: Home / Self Care | Payer: Self-pay | Source: Home / Self Care | Attending: Ophthalmology

## 2024-09-13 ENCOUNTER — Ambulatory Visit: Payer: Self-pay | Admitting: Anesthesiology

## 2024-09-13 ENCOUNTER — Other Ambulatory Visit: Payer: Self-pay

## 2024-09-13 ENCOUNTER — Encounter: Payer: Self-pay | Admitting: Ophthalmology

## 2024-09-13 ENCOUNTER — Ambulatory Visit
Admission: RE | Admit: 2024-09-13 | Discharge: 2024-09-13 | Disposition: A | Discharge status: Home / Self Care | Attending: Ophthalmology | Admitting: Ophthalmology

## 2024-09-13 DIAGNOSIS — Z9842 Cataract extraction status, left eye: Secondary | ICD-10-CM | POA: Insufficient documentation

## 2024-09-13 DIAGNOSIS — N182 Chronic kidney disease, stage 2 (mild): Secondary | ICD-10-CM | POA: Diagnosis not present

## 2024-09-13 DIAGNOSIS — Z961 Presence of intraocular lens: Secondary | ICD-10-CM | POA: Insufficient documentation

## 2024-09-13 DIAGNOSIS — I10 Essential (primary) hypertension: Secondary | ICD-10-CM | POA: Insufficient documentation

## 2024-09-13 DIAGNOSIS — H2511 Age-related nuclear cataract, right eye: Secondary | ICD-10-CM | POA: Insufficient documentation

## 2024-09-13 DIAGNOSIS — I129 Hypertensive chronic kidney disease with stage 1 through stage 4 chronic kidney disease, or unspecified chronic kidney disease: Secondary | ICD-10-CM | POA: Diagnosis not present

## 2024-09-13 DIAGNOSIS — E785 Hyperlipidemia, unspecified: Secondary | ICD-10-CM | POA: Diagnosis not present

## 2024-09-13 SURGERY — PHACOEMULSIFICATION, CATARACT, WITH IOL INSERTION
Anesthesia: Monitor Anesthesia Care | Site: Eye | Laterality: Right

## 2024-09-13 MED ORDER — MIDAZOLAM HCL 2 MG/2ML IJ SOLN
INTRAMUSCULAR | Status: AC
  Filled 2024-09-13: qty 2

## 2024-09-13 MED ORDER — TETRACAINE HCL 0.5 % OP SOLN
1.0000 [drp] | OPHTHALMIC | Status: DC | PRN
  Administered 2024-09-13 (×3): 1 [drp] via OPHTHALMIC

## 2024-09-13 MED ORDER — LACTATED RINGERS IV SOLN: INTRAVENOUS | Status: DC

## 2024-09-13 MED ORDER — FENTANYL CITRATE (PF) 100 MCG/2ML IJ SOLN
INTRAMUSCULAR | Status: AC
  Filled 2024-09-13: qty 2

## 2024-09-13 MED ORDER — ARMC OPHTHALMIC DILATING DROPS
1.0000 | OPHTHALMIC | Status: DC | PRN
  Administered 2024-09-13 (×3): 1 via OPHTHALMIC

## 2024-09-13 MED ORDER — FENTANYL CITRATE (PF) 100 MCG/2ML IJ SOLN
INTRAMUSCULAR | Status: DC | PRN
  Administered 2024-09-13: 50 ug via INTRAVENOUS

## 2024-09-13 MED ORDER — MIDAZOLAM HCL (PF) 2 MG/2ML IJ SOLN
INTRAMUSCULAR | Status: DC | PRN
Start: 2024-09-13 — End: 2024-09-13
  Administered 2024-09-13: 2 mg via INTRAVENOUS

## 2024-09-13 MED ORDER — ARMC OPHTHALMIC DILATING DROPS
OPHTHALMIC | Status: AC
Start: 2024-09-13 — End: 2024-09-13
  Filled 2024-09-13: qty 0.5

## 2024-09-13 MED ORDER — TETRACAINE HCL 0.5 % OP SOLN
OPHTHALMIC | Status: AC
  Filled 2024-09-13: qty 4

## 2024-09-13 MED ORDER — SIGHTPATH DOSE#1 BSS IO SOLN
INTRAOCULAR | Status: DC | PRN
  Administered 2024-09-13: 48 mL via OPHTHALMIC

## 2024-09-13 MED ORDER — SIGHTPATH DOSE#1 BSS IO SOLN
INTRAOCULAR | Status: DC | PRN
  Administered 2024-09-13: 15 mL via INTRAOCULAR

## 2024-09-13 MED ORDER — SIGHTPATH DOSE#1 NA HYALUR & NA CHOND-NA HYALUR IO KIT
PACK | INTRAOCULAR | Status: DC | PRN
  Administered 2024-09-13: 1 via OPHTHALMIC

## 2024-09-13 MED ORDER — LIDOCAINE HCL (PF) 2 % IJ SOLN
INTRAOCULAR | Status: DC | PRN
  Administered 2024-09-13: 2 mL

## 2024-09-13 MED ORDER — BRIMONIDINE TARTRATE-TIMOLOL 0.2-0.5 % OP SOLN
OPHTHALMIC | Status: DC | PRN
  Administered 2024-09-13: 1 [drp] via OPHTHALMIC

## 2024-09-13 MED ORDER — CEFUROXIME OPHTHALMIC INJECTION 1 MG/0.1 ML
INJECTION | OPHTHALMIC | Status: DC | PRN
  Administered 2024-09-13: .1 mL via INTRACAMERAL

## 2024-09-13 SURGICAL SUPPLY — 8 items
FEE CATARACT SUITE SIGHTPATH (MISCELLANEOUS) ×1 IMPLANT
GLOVE BIOGEL PI IND STRL 8 (GLOVE) ×1 IMPLANT
GLOVE SURG LX STRL 7.5 STRW (GLOVE) ×1 IMPLANT
GLOVE SURG SYN 6.5 PF PI BL (GLOVE) ×1 IMPLANT
LENS IOL CLRN PAN PRO TRC 19.0 IMPLANT
NDL FILTER BLUNT 18X1 1/2 (NEEDLE) ×1 IMPLANT
NEEDLE FILTER BLUNT 18X1 1/2 (NEEDLE) ×1 IMPLANT
SYR 3ML LL SCALE MARK (SYRINGE) ×1 IMPLANT

## 2024-09-13 NOTE — Transfer of Care (Signed)
 Immediate Anesthesia Transfer of Care Note  Patient: Caroline Bowen  Procedure(s) Performed: PHACOEMULSIFICATION, CATARACT, WITH IOL INSERTION 3.67 00:23.4 (Right: Eye)  Patient Location: PACU  Anesthesia Type: MAC  Level of Consciousness: awake, alert  and patient cooperative  Airway and Oxygen Therapy: Patient Spontanous Breathing   Post-op Assessment: Post-op Vital signs reviewed, Patient's Cardiovascular Status Stable, Respiratory Function Stable, Patent Airway and No signs of Nausea or vomiting  Post-op Vital Signs: Reviewed and stable  Complications: No notable events documented.

## 2024-09-13 NOTE — Anesthesia Postprocedure Evaluation (Signed)
 Anesthesia Post Note  Patient: Caroline Bowen  Procedure(s) Performed: PHACOEMULSIFICATION, CATARACT, WITH IOL INSERTION 3.67 00:23.4 (Right: Eye)  Patient location during evaluation: PACU Anesthesia Type: MAC Level of consciousness: awake and alert Pain management: pain level controlled Vital Signs Assessment: post-procedure vital signs reviewed and stable Respiratory status: spontaneous breathing, nonlabored ventilation, respiratory function stable and patient connected to nasal cannula oxygen Cardiovascular status: stable and blood pressure returned to baseline Postop Assessment: no apparent nausea or vomiting Anesthetic complications: no   No notable events documented.   Last Vitals:  Vitals:   09/13/24 1101 09/13/24 1107  BP: (!) 162/91 (!) 174/92  Pulse: (!) 57 (!) 55  Resp: 14 13  Temp: 36.4 C   SpO2: 99% 99%    Last Pain:  Vitals:   09/13/24 1107  TempSrc:   PainSc: 0-No pain                 Labrina Lines C Jaxxson Cavanah

## 2024-09-13 NOTE — Op Note (Signed)
 LOCATION:  [facility]   PREOPERATIVE DIAGNOSIS:    Nuclear sclerotic cataract right eye. H25.11   POSTOPERATIVE DIAGNOSIS:  Nuclear sclerotic cataract right eye.     PROCEDURE:  Phacoemusification with posterior chamber intraocular lens placement of the right eye   ULTRASOUND TIME: Procedure(s): PHACOEMULSIFICATION, CATARACT, WITH IOL INSERTION 3.67 00:23.4 (Right)  LENS:   Implant Name Type Inv. Item Serial No. Manufacturer Lot No. LRB No. Used Action  LENS IOL CLRN PAN PRO TRC 19.0 - [id number]  LENS IOL CLRN PAN PRO TRC 19.0 [id number] [employer]  Right 1 Implanted         SURGEON:  Dene FABIENE Etienne, MD   ANESTHESIA:  Topical with tetracaine drops and 2% Xylocaine jelly, augmented with 1% preservative-free intracameral lidocaine.    COMPLICATIONS:  None.   DESCRIPTION OF PROCEDURE:  The patient was identified in the holding room and transported to the operating room and placed in the supine position under the operating microscope.  The right eye was identified as the operative eye and it was prepped and draped in the usual sterile ophthalmic fashion.   A 1 millimeter clear-corneal paracentesis was made at the 12:00 position.  0.5 ml of preservative-free 1% lidocaine was injected into the anterior chamber. The anterior chamber was filled with Viscoat viscoelastic.  A 2.4 millimeter keratome was used to make a near-clear corneal incision at the 9:00 position.  A curvilinear capsulorrhexis was made with a cystotome and capsulorrhexis forceps.  Balanced salt solution was used to hydrodissect and hydrodelineate the nucleus.   Phacoemulsification was then used in stop and chop fashion to remove the lens nucleus and epinucleus.  The remaining cortex was then removed using the irrigation and aspiration handpiece. Provisc was then placed into the capsular bag to distend it for lens placement.  A lens was then injected into the capsular bag.  The remaining viscoelastic was  aspirated.   Wounds were hydrated with balanced salt solution.  The anterior chamber was inflated to a physiologic pressure with balanced salt solution.  No wound leaks were noted. Cefuroxime 0.1 ml of a 10mg /ml solution was injected into the anterior chamber for a dose of 1 mg of intracameral antibiotic at the completion of the case.   Timolol and Brimonidine drops were applied to the eye.  The patient was taken to the recovery room in stable condition without complications of anesthesia or surgery.   Adesuwa Osgood 09/13/2024, 10:59 AM

## 2024-09-13 NOTE — Anesthesia Preprocedure Evaluation (Addendum)
 Anesthesia Evaluation  Patient identified by MRN, date of birth, ID band Patient awake    Reviewed: Allergy & Precautions, H&P , NPO status , Patient's Chart, lab work & pertinent test results  Airway Mallampati: II  TM Distance: >3 FB Neck ROM: Full  Mouth opening: Limited Mouth Opening Comment: Mouth opening: Limited Mouth Opening Comment: Would likely have a good airway, but has rubber bands and braces, and can't open mouth very much  Dental no notable dental hx.  Braces and rubber bands, top and bottom:   Pulmonary neg pulmonary ROS   Pulmonary exam normal breath sounds clear to auscultation       Cardiovascular hypertension, negative cardio ROS Normal cardiovascular exam Rhythm:Regular Rate:Normal     Neuro/Psych  Headaches negative neurological ROS  negative psych ROS   GI/Hepatic negative GI ROS, Neg liver ROS,,,  Endo/Other  negative endocrine ROS    Renal/GU Renal diseasenegative Renal ROS  negative genitourinary   Musculoskeletal negative musculoskeletal ROS (+)    Abdominal   Peds negative pediatric ROS (+)  Hematology negative hematology ROS (+) Blood dyscrasia, anemia   Anesthesia Other Findings Previous cataract surgery 08-30-24 Dr. Ola   Anemia HTN   Lovely lady here with her sister, asked for prayer and we prayed last visit   Reproductive/Obstetrics negative OB ROS                              Anesthesia Physical Anesthesia Plan  ASA: 2  Anesthesia Plan: MAC   Post-op Pain Management:    Induction: Intravenous  PONV Risk Score and Plan:   Airway Management Planned: Natural Airway and Nasal Cannula  Additional Equipment:   Intra-op Plan:   Post-operative Plan:   Informed Consent: I have reviewed the patients History and Physical, chart, labs and discussed the procedure including the risks, benefits and alternatives for the proposed anesthesia with  the patient or authorized representative who has indicated his/her understanding and acceptance.     Dental Advisory Given  Plan Discussed with: Anesthesiologist, CRNA and Surgeon  Anesthesia Plan Comments: (Patient consented for risks of anesthesia including but not limited to:  - adverse reactions to medications - damage to eyes, teeth, lips or other oral mucosa - nerve damage due to positioning  - sore throat or hoarseness - Damage to heart, brain, nerves, lungs, other parts of body or loss of life  Patient voiced understanding and assent.)         Anesthesia Quick Evaluation

## 2024-09-13 NOTE — H&P (Signed)
 [facility]   Primary Care Physician:  Myrla Jon HERO, MD Ophthalmologist: Dr. Dene Etienne  Pre-Procedure History & Physical: HPI:  Caroline Bowen is a 63 y.o. female here for ophthalmic surgery.   Past Medical History:  Diagnosis Date   Anemia    in the past   Hypertension    Pre-diabetes     Past Surgical History:  Procedure Laterality Date   CATARACT EXTRACTION W/PHACO Left 08/30/2024   Procedure: PHACOEMULSIFICATION, CATARACT, WITH IOL INSERTION 4.02, 00:41.0;  Surgeon: Etienne Dene, MD;  Location: [facility];  Service: Ophthalmology;  Laterality: Left;   COLONOSCOPY WITH PROPOFOL  N/A 04/16/2023   Procedure: COLONOSCOPY WITH PROPOFOL ;  Surgeon: Unk Corinn Skiff, MD;  Location: [facility];  Service: Gastroenterology;  Laterality: N/A;   CYST EXCISION Right 2001   wrist   wrist surgery  2001    Prior to Admission medications   Medication Sig Start Date End Date Taking? Authorizing Provider  gabapentin  (NEURONTIN ) 300 MG capsule TAKE 1 CAPSULE BY MOUTH THREE TIMES A DAY 05/19/24  Yes Clifton, Kellie A, FNP  rosuvastatin  (CRESTOR ) 5 MG tablet TAKE 1 TABLET (5 MG TOTAL) BY MOUTH DAILY. 05/22/24  Yes Bacigalupo, Jon HERO, MD  triamterene -hydrochlorothiazide (MAXZIDE-25) 37.5-25 MG tablet Take 1 tablet by mouth daily. 09/09/23  Yes Bacigalupo, Angela M, MD  vitamin C (ASCORBIC ACID) 500 MG tablet Take 500 mg by mouth daily.   Yes [provider]    Allergies as of 08/02/2024 - Review Complete 07/19/2024  Allergen Reaction Noted   Metoprolol Swelling 05/24/2015    Family History  Problem Relation Age of Onset   Hypertension Mother    Cancer Mother        ovarian   Ovarian cancer Mother    Hypertension Father    Diabetes Father    Kidney disease Father    Prostate cancer Father    Heart failure Father    Diabetes Sister    Hypertension Sister    Hypertension Brother    Stroke Brother    Hypertension Sister     Pancreatic cancer Brother    Hypertension Brother    Breast cancer Neg Hx    Colon cancer Neg Hx     Social History   Socioeconomic History   Marital status: Married    Spouse name: Marolyn   Number of children: 1   Years of education: 16   Highest education level: Bachelor's degree (e.g., BA, AB, BS)  Occupational History   Not on file  Tobacco Use   Smoking status: Never   Smokeless tobacco: Never  Vaping Use   Vaping status: Never Used  Substance and Sexual Activity   Alcohol use: Yes    Comment: very rare 3 x year   Drug use: Not on file    Comment: in 20's   Sexual activity: Yes    Birth control/protection: Post-menopausal  Other Topics Concern   Not on file  Social History Narrative   Not on file   Social Drivers of Health   Financial Resource Strain: Low Risk  (11/26/2017)   Overall Financial Resource Strain (CARDIA)    Difficulty of Paying Living Expenses: Not hard at all  Food Insecurity: No Food Insecurity (11/26/2017)   Hunger Vital Sign    Worried About Running Out of Food in the Last Year: Never true    Ran Out of Food in the Last Year: Never true  Transportation Needs: No Transportation Needs (11/26/2017)   PRAPARE -  Administrator, Civil Service (Medical): No    Lack of Transportation (Non-Medical): No  Physical Activity: Insufficiently Active (11/26/2017)   Exercise Vital Sign    Days of Exercise per Week: 3 days    Minutes of Exercise per Session: 20 min  Stress: Not on file  Social Connections: Not on file  Intimate Partner Violence: Not on file    Review of Systems: See HPI, otherwise negative ROS  Physical Exam: BP (!) 151/93   Pulse (!) 57   Temp 98.6 F (37 C) (Temporal)   Resp 20   Ht 5' 6 (1.676 m)   Wt 71.4 kg   SpO2 99%   BMI 25.41 kg/m  General:   Alert,  pleasant and cooperative in NAD Head:  Normocephalic and atraumatic. Lungs:  Clear to auscultation.    Heart:  Regular rate and rhythm.   Impression/Plan: Caroline Bowen is here for ophthalmic surgery.  Risks, benefits, limitations, and alternatives regarding ophthalmic surgery have been reviewed with the patient.  Questions have been answered.  All parties agreeable.   Caroline GASKIN, MD  09/13/2024, 9:49 AM

## 2024-09-14 ENCOUNTER — Ambulatory Visit (INDEPENDENT_AMBULATORY_CARE_PROVIDER_SITE_OTHER): Admitting: Family Medicine

## 2024-09-14 ENCOUNTER — Encounter: Payer: Self-pay | Admitting: Family Medicine

## 2024-09-14 VITALS — BP 130/82 | HR 60 | Ht 66.0 in | Wt 158.0 lb

## 2024-09-14 DIAGNOSIS — N182 Chronic kidney disease, stage 2 (mild): Secondary | ICD-10-CM

## 2024-09-14 DIAGNOSIS — M6740 Ganglion, unspecified site: Secondary | ICD-10-CM

## 2024-09-14 DIAGNOSIS — I10 Essential (primary) hypertension: Secondary | ICD-10-CM

## 2024-09-14 DIAGNOSIS — R7303 Prediabetes: Secondary | ICD-10-CM | POA: Diagnosis not present

## 2024-09-14 NOTE — Assessment & Plan Note (Signed)
 Chronic kidney disease stage 2 is present but not significant. Monitoring includes regular blood work to assess kidney function and electrolytes. - Order blood work to check kidney function and electrolytes

## 2024-09-14 NOTE — Progress Notes (Signed)
 Established patient visit   Patient: Caroline Bowen   DOB: May 22, 1961   63 y.o. Female  MRN: [id number] Visit Date: 09/14/2024  Today's healthcare provider: Jon Eva, MD   Chief Complaint  Patient presents with   Medical Management of Chronic Issues    Patient reports taking medication as prescribed with no symptoms to report. Reports checking blood pressure if she feels symptoms.    Hyperlipidemia   Hypertension   Pre-Diabetes   Subjective    Hyperlipidemia  Hypertension   HPI     Medical Management of Chronic Issues    Additional comments: Patient reports taking medication as prescribed with no symptoms to report. Reports checking blood pressure if she feels symptoms.       Last edited by Lilian Fitzpatrick, CMA on 09/14/2024  8:59 AM.       Discussed the use of AI scribe software for clinical note transcription with the patient, who gave verbal consent to proceed.  History of Present Illness   Caroline Bowen is a 63 year old female with hypertension, prediabetes, and fibromyalgia who presents for a routine follow-up. She is accompanied by her sister, [name], who is her driver today.  Her blood pressure was elevated during her last visit in August, but she has not monitored it at home recently. She takes triamterene  HCTZ for hypertension and rosuvastatin  for cholesterol. Gabapentin  is used for hot flashes. She underwent cataract surgery on both eyes, with the latest procedure completed yesterday, and is satisfied with the outcome.  She maintains regular physical activity, walking two and a half miles at least four days a week. However, she acknowledges a decline in dietary diligence, which may impact her A1c levels. A ganglion cyst on her wrist, first noticed in April, is present but asymptomatic. Her living situation is currently disrupted due to ongoing home Holiday representative.        Medications: Outpatient Medications Prior to Visit  Medication Sig    gabapentin  (NEURONTIN ) 300 MG capsule TAKE 1 CAPSULE BY MOUTH THREE TIMES A DAY   rosuvastatin  (CRESTOR ) 5 MG tablet TAKE 1 TABLET (5 MG TOTAL) BY MOUTH DAILY.   triamterene -hydrochlorothiazide (MAXZIDE-25) 37.5-25 MG tablet Take 1 tablet by mouth daily.   vitamin C (ASCORBIC ACID) 500 MG tablet Take 500 mg by mouth daily.   No facility-administered medications prior to visit.    Review of Systems     Objective    BP 130/82 (BP Location: Left Arm, Patient Position: Sitting, Cuff Size: Normal)   Pulse 60   Ht 5' 6 (1.676 m)   Wt 158 lb (71.7 kg)   SpO2 100%   BMI 25.50 kg/m    Physical Exam Vitals reviewed.  Constitutional:      General: She is not in acute distress.    Appearance: Normal appearance. She is well-developed. She is not diaphoretic.  HENT:     Head: Normocephalic and atraumatic.  Eyes:     General: No scleral icterus.    Conjunctiva/sclera: Conjunctivae normal.  Neck:     Thyroid: No thyromegaly.  Cardiovascular:     Rate and Rhythm: Normal rate and regular rhythm.     Heart sounds: Normal heart sounds. No murmur heard. Pulmonary:     Effort: Pulmonary effort is normal. No respiratory distress.     Breath sounds: Normal breath sounds. No wheezing, rhonchi or rales.  Musculoskeletal:     Cervical back: Neck supple.     Right lower leg: No  edema.     Left lower leg: No edema.  Lymphadenopathy:     Cervical: No cervical adenopathy.  Skin:    General: Skin is warm and dry.     Findings: No rash.  Neurological:     Mental Status: She is alert and oriented to person, place, and time. Mental status is at baseline.  Psychiatric:        Mood and Affect: Mood normal.        Behavior: Behavior normal.    Ganglion cyst on base of L thumb  No results found for any visits on 09/14/24.  Assessment & Plan     Problem List Items Addressed This Visit       Cardiovascular and Mediastinum   Essential hypertension - Primary   Hypertension is managed with  triamterene  HCTZ. Blood pressure readings have been variable, with the current reading at 130/82 mmHg, an improvement from previous readings but not as optimal as in April. Discussed the potential need for a second antihypertensive medication if hypertension persists, as it may worsen over time. She has been on the current medication regimen for years without changes. - Recheck blood pressure at home and document readings - Consider adding a second antihypertensive medication if blood pressure remains high      Relevant Orders   Basic Metabolic Panel (BMET)     Genitourinary   Stage 2 chronic kidney disease   Chronic kidney disease stage 2 is present but not significant. Monitoring includes regular blood work to assess kidney function and electrolytes. - Order blood work to check kidney function and electrolytes      Relevant Orders   Basic Metabolic Panel (BMET)     Other   Prediabetes   Prediabetes is monitored with A1c levels. She has not been adhering to dietary recommendations but continues regular exercise. Anticipates a possible increase in A1c due to dietary habits. Plans to make dietary modifications based on A1c results. - Order A1c test - Discuss dietary modifications based on A1c results      Relevant Orders   Hemoglobin A1c   Other Visit Diagnoses       Ganglion cyst               Hot Flashes Hot flashes are managed with gabapentin  as a non-hormonal treatment option.  Cataracts (Post-Surgery) Recently underwent cataract surgery on both eyes, with the left eye showing excellent results.  Ganglion Cyst A ganglion cyst is present without symptoms. Advised to leave it alone unless it becomes bothersome, at which point further intervention could be considered. Discussed potential options such as ultrasound-guided injection or surgical removal if symptomatic. - Monitor the ganglion cyst for changes or symptoms  General Health Maintenance Up to date with  vaccinations, including the flu shot. No additional vaccinations are needed at this time.  Follow-up Follow-up will be based on lab results and routine health maintenance. - Schedule follow-up appointment in six months for physical - Consider earlier follow-up if lab results indicate a need        Return in about 6 months (around 03/15/2025) for CPE.       Jon Eva, MD  [facility] [phone] (phone) [phone] (fax)  [facility]

## 2024-09-14 NOTE — Assessment & Plan Note (Signed)
 Hypertension is managed with triamterene  HCTZ. Blood pressure readings have been variable, with the current reading at 130/82 mmHg, an improvement from previous readings but not as optimal as in April. Discussed the potential need for a second antihypertensive medication if hypertension persists, as it may worsen over time. She has been on the current medication regimen for years without changes. - Recheck blood pressure at home and document readings - Consider adding a second antihypertensive medication if blood pressure remains high

## 2024-09-14 NOTE — Assessment & Plan Note (Signed)
 Prediabetes is monitored with A1c levels. She has not been adhering to dietary recommendations but continues regular exercise. Anticipates a possible increase in A1c due to dietary habits. Plans to make dietary modifications based on A1c results. - Order A1c test - Discuss dietary modifications based on A1c results

## 2024-09-15 ENCOUNTER — Ambulatory Visit: Payer: Self-pay | Admitting: Family Medicine

## 2024-09-15 LAB — BASIC METABOLIC PANEL WITH GFR
BUN/Creatinine Ratio: 14 (ref 12–28)
BUN: 16 mg/dL (ref 8–27)
CO2: 26 mmol/L (ref 20–29)
Calcium: 10.5 mg/dL — ABNORMAL HIGH (ref 8.7–10.3)
Chloride: 103 mmol/L (ref 96–106)
Creatinine, Ser: 1.13 mg/dL — ABNORMAL HIGH (ref 0.57–1.00)
Glucose: 86 mg/dL (ref 70–99)
Potassium: 4.4 mmol/L (ref 3.5–5.2)
Sodium: 142 mmol/L (ref 134–144)
eGFR: 55 mL/min/1.73 — ABNORMAL LOW (ref >59)

## 2024-09-15 LAB — HEMOGLOBIN A1C
Est. average glucose Bld gHb Est-mCnc: 128 mg/dL
Hgb A1c MFr Bld: 6.1 % — ABNORMAL HIGH (ref 4.8–5.6)

## 2024-10-23 ENCOUNTER — Ambulatory Visit: Payer: Self-pay

## 2024-10-23 NOTE — Telephone Encounter (Signed)
 FYI Only or Action Required?: FYI only for provider: appointment scheduled on 10/24/24.  Patient was last seen in primary care on 09/14/2024 by Myrla Jon HERO, MD.  Called Nurse Triage reporting Numbness.  Symptoms began several weeks ago.  Interventions attempted: Rest, hydration, or home remedies.  Symptoms are: stable.  Triage Disposition: See HCP Within 4 Hours (Or PCP Triage)  Patient/caregiver understands and will follow disposition?: No, wishes to speak with PCP     Copied from CRM #[id number]. Topic: Clinical - Red Word Triage >> Oct 23, 2024  8:17 AM Caroline Bowen wrote: Red Word that prompted transfer to Nurse Triage: Right hand numbness/tingling for two weeks. No other symptoms. Has been removing 3 layers of wallpaper in her home, possibly aggravated the muscles she believes. Cannot use her right hand to brush her teeth. Reason for Disposition  [1] Numbness (i.e., loss of sensation) of the face, arm / hand, or leg / foot on one side of the body AND [2] gradual onset (e.g., days to weeks) AND [3] present now  Answer Assessment - Initial Assessment Questions Pt notes about 3 weeks ago she helped to remove 3 layers of wallpaper from a home she and her spouse were renovating and notes she felt she overused her hand/arm as she reports she has since been experiencing intermittent hand numbness with occasional tingling that sometimes moves up to elbow. Pt notes these symptoms are typically present first thing in the morning and then resolve about 30 min after rest and typically only reappear with overuse of that hand and resolve soon after rest. Pt denies vision or speech changes, dizziness, HA, numbness, weakness or tingling to any other part of body. Pt declines appt with alt provider today as she notes she only feels comfortable seeing PCP. This RN educated pt on home care, new-worsening symptoms, when to call back/seek emergent care. Pt verbalized understanding and agrees to  plan.     1. SYMPTOM: What is the main symptom you are concerned about? (e.g., weakness, numbness)     Numbness 2. ONSET: When did this start? (e.g., minutes, hours, days; while sleeping)     X 2-3 weeks ago 3. LAST NORMAL: When was the last time you (the patient) were normal (no symptoms)?     About 3 weeks ago 4. PATTERN Does this come and go, or has it been constant since it started?  Is it present now?     Intermittent, not present now 5. CARDIAC SYMPTOMS: Have you had any of the following symptoms: chest pain, difficulty breathing, palpitations?     None 6. NEUROLOGIC SYMPTOMS: Have you had any of the following symptoms: headache, dizziness, vision loss, double vision, changes in speech, unsteady on your feet?     Denies 7. OTHER SYMPTOMS: Do you have any other symptoms?     Notes hand feels tired after use first thing in the AM, resolves within 30 minutes  Protocols used: Neurologic Deficit-A-AH

## 2024-10-23 NOTE — Telephone Encounter (Signed)
 Please advise

## 2024-10-23 NOTE — Telephone Encounter (Signed)
 Scheduled for appt tomorrow with me. Will assess then

## 2024-10-24 ENCOUNTER — Ambulatory Visit: Admitting: Family Medicine

## 2024-10-24 ENCOUNTER — Encounter: Payer: Self-pay | Admitting: Family Medicine

## 2024-10-24 VITALS — BP 148/85 | HR 64 | Ht 66.0 in | Wt 160.3 lb

## 2024-10-24 DIAGNOSIS — M5412 Radiculopathy, cervical region: Secondary | ICD-10-CM

## 2024-10-24 MED ORDER — PREDNISONE 10 MG PO TABS: ORAL_TABLET | ORAL | 0 refills | Status: DC

## 2024-10-24 NOTE — Progress Notes (Signed)
 Acute visit   Patient: Caroline Bowen   DOB: [birth year]   63 y.o. Female  MRN: [id number] PCP: Myrla Jon HERO, MD   Chief Complaint  Patient presents with   Acute Visit    Hand numbness, tingling//TriageRN. Patient reported right hand numbness/tingling for two weeks. No other symptoms. Has been removing 3 layers of wallpaper in her home, possibly aggravated the muscles she believes.Cannot use her right hand to brush her teeth. She reports symptoms intermittent and that her hand feels tired after use first thing in the AM, resolves within 30 minutes   Hand Problem    She reports that she slept better last night and she could still feel the numbness and tingling this morning. She reports no pain but feels out of her normal. Hx of cyst removal on right wrist in 2001.    Subjective    Discussed the use of AI scribe software for clinical note transcription with the patient, who gave verbal consent to proceed.  History of Present Illness   Caroline Bowen is a 63 year old female who presents with numbness and tingling in her right arm and fingers. She is accompanied by her husband.  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 Symptoms occur mainly at night as she prepares for bed and resolve by the end of her morning routine. Numbness sometimes reaches the mid-arm but does not go past the elbow.  During the day she is usually symptom-free, with occasional numbness when holding an electric toothbrush, which makes her switch hands. A heating pad to the arm helps her sleep through the night. She usually sleeps on her right side and is trying to sleep on her back to reduce symptoms. She denies neck pain and can perform daily activities, with only occasional mild numbness during the day.       Review of Systems  Objective    BP (!) 148/85 (BP Location: Left Arm, Patient Position: Sitting, Cuff Size: Normal)    Pulse 64   Ht 5' 6 (1.676 m)   Wt 160 lb 4.8 oz (72.7 kg)   SpO2 100%   BMI 25.87 kg/m   Physical Exam Vitals reviewed.  Constitutional:      General: She is not in acute distress.    Appearance: She is well-developed.  HENT:     Head: Normocephalic and atraumatic.  Eyes:     General: No scleral icterus.    Conjunctiva/sclera: Conjunctivae normal.  Cardiovascular:     Rate and Rhythm: Normal rate and regular rhythm.     Heart sounds: Normal heart sounds. No murmur heard. Pulmonary:     Effort: Pulmonary effort is normal. No respiratory distress.     Breath sounds: Normal breath sounds.  Musculoskeletal:     Comments: No R arm TTP, no neck TTP Negative Sprulings ROM intact Strength and sensation intact Negative Tinels and Palens  Skin:    General: Skin is warm and dry.     Findings: No rash.  Neurological:     Mental Status: She is alert and oriented to person, place, and time.     Sensory: No sensory deficit.     Motor: No weakness.  Psychiatric:        Behavior: Behavior normal.       No results found for any visits on 10/24/24.  Assessment & Plan     Problem List Items  Addressed This Visit   None Visit Diagnoses       Cervical radiculopathy    -  Primary           Cervical radiculopathy Intermittent numbness and tingling in the right arm and hand, primarily nocturnal, likely due to cervical radiculopathy. Symptoms improve with heat application and are exacerbated by certain sleeping positions. No neck pain reported. Physical examination reveals tight trapezius muscles and no carpal tunnel syndrome. Differential diagnosis includes cervical radiculopathy due to nerve compression in the neck, likely exacerbated by repetitive shoulder motions and poor sleeping posture. Stress and life events may contribute to muscle tension. - Prescribed a 6-day taper of prednisone  to reduce inflammation. - Advised applying heat to the shoulder before bed, avoiding  sleeping on a heating pad due to burn risk. - Recommended hot showers to decompress the shoulder and neck. - Suggested massage therapy for stress relief and muscle relaxation. - Instructed to monitor for any weakness in the hand or persistent numbness, and to report if symptoms worsen. - Advised against further repetitive shoulder activities, such as wallpapering, until symptoms improve.        Meds ordered this encounter  Medications   predniSONE  (DELTASONE ) 10 MG tablet    Sig: Take 60mg  PO daily x1d, then 50mg  daily x1d, then 40mg  daily x1d, then 30mg  daily x1d, then 20mg  daily x1d, then 10mg  daily x1d, then stop    Dispense:  21 tablet    Refill:  0     Return if symptoms worsen or fail to improve.      Jon Eva, MD  [facility] [phone] (phone) [phone] (fax)  [facility]

## 2024-11-05 ENCOUNTER — Other Ambulatory Visit: Payer: Self-pay | Admitting: Family Medicine

## 2024-11-05 DIAGNOSIS — I10 Essential (primary) hypertension: Secondary | ICD-10-CM

## 2024-11-18 ENCOUNTER — Other Ambulatory Visit: Payer: Self-pay | Admitting: Physician Assistant

## 2024-11-18 DIAGNOSIS — R21 Rash and other nonspecific skin eruption: Secondary | ICD-10-CM

## 2024-12-14 ENCOUNTER — Ambulatory Visit: Payer: Self-pay

## 2024-12-14 NOTE — Telephone Encounter (Signed)
 Ok to send a refill of the prednisone  that I gave at that last visit.  Can see if I have a sooner acute appt than 2/17 also.

## 2024-12-14 NOTE — Telephone Encounter (Signed)
 FYI Only or Action Required?: Action required by provider: request for appointment, clinical question for provider, and update on patient condition.  Patient was last seen in primary care on 10/24/2024 by Myrla Jon HERO, MD.  Called Nurse Triage reporting Numbness.  Symptoms began a week ago.  Interventions attempted: OTC medications: extra strength tylenol, Prescription medications: 6 days prednisone  cleared it completely previously until re-aggravated per pt, Ice/heat application, and Other: positioning.  Symptoms are: gradually worsening.  Triage Disposition: See HCP Within 4 Hours (Or PCP Triage)  Patient/caregiver understands and will follow disposition?: No, wishes to speak with PCP      Reason for Disposition  [1] Tingling (e.g., pins and needles) of the face, arm / hand, or leg / foot on one side of the body AND [2] present now  (Exceptions: Chronic/recurrent symptom lasting > 4 weeks; or from known cause, such as: bumped elbow, carpal tunnel, pinched nerve.)  Answer Assessment - Initial Assessment Questions This RN recommended pt be examined in next 4 hours, pt refusing disposition, requesting next available with Dr. Myrla only, scheduled next available for 2/17 per pt request, placed on wait list. Advised pt call back or seek immediate care if any new or worsening symptoms. Sending message to PCP office for call back to pt with further recommendations in meantime, needs sooner appt if can fit into schedule.      1. SYMPTOM: What is the main symptom you are concerned about? (e.g., weakness, numbness)     Numbness/tingling in right hand Saw Dr. KATHEE in December before Christmas for this, gave prednisone  6 days and that worked after couple days, symptoms went away until 1-2 weeks ago when was in process of moving houses, think re-aggravated it with a vengeance Confirms same numbness/tingling just stronger  4. PATTERN Does this come and go, or has it been constant  since it started?  Is it present now?     Comes and goes During day no impact to regular activities, not feeling it at all during the day Only present when laying down, then in some positions it goes away  5. CARDIAC SYMPTOMS: Have you had any of the following symptoms: chest pain, difficulty breathing, palpitations?     Denies  6. NEUROLOGIC SYMPTOMS: Have you had any of the following symptoms: headache, dizziness, vision loss, double vision, changes in speech, unsteady on your feet?     Denies  7. OTHER SYMPTOMS: Do you have any other symptoms?     Denies  Some relief with heating pad, not supposed to sleep on it though Some relief with extra strength tylenol Instant relief with folding elbow and laying on the side with body pressure on it in bed  Denies: Weakness New numbness Confusion Slurred speech Headache Back/neck pain  Protocols used: Neurologic Deficit-A-AH

## 2024-12-15 ENCOUNTER — Other Ambulatory Visit: Payer: Self-pay

## 2024-12-15 MED ORDER — PREDNISONE 10 MG PO TABS: ORAL_TABLET | ORAL | 0 refills | Status: AC

## 2024-12-15 MED ORDER — PREDNISONE 10 MG PO TABS: ORAL_TABLET | ORAL | 0 refills | Status: DC

## 2024-12-15 NOTE — Addendum Note (Signed)
 Addended by: LILIAN SEVERO RAMAN on: 12/15/2024 08:35 AM   Modules accepted: Orders

## 2024-12-15 NOTE — Telephone Encounter (Signed)
 Pt advised. Appt scheduled for the 29th at 4:00 pm

## 2024-12-20 ENCOUNTER — Other Ambulatory Visit: Payer: Self-pay | Admitting: Family Medicine

## 2024-12-20 DIAGNOSIS — Z1231 Encounter for screening mammogram for malignant neoplasm of breast: Secondary | ICD-10-CM

## 2024-12-21 ENCOUNTER — Ambulatory Visit
Admission: RE | Admit: 2024-12-21 | Discharge: 2024-12-21 | Disposition: A | Discharge status: Home / Self Care | Attending: Family Medicine | Admitting: Family Medicine

## 2024-12-21 ENCOUNTER — Ambulatory Visit
Admission: RE | Admit: 2024-12-21 | Discharge: 2024-12-21 | Disposition: A | Discharge status: Home / Self Care | Source: Ambulatory Visit | Attending: Family Medicine | Admitting: Family Medicine

## 2024-12-21 ENCOUNTER — Encounter: Payer: Self-pay | Admitting: Family Medicine

## 2024-12-21 ENCOUNTER — Ambulatory Visit (INDEPENDENT_AMBULATORY_CARE_PROVIDER_SITE_OTHER): Admitting: Family Medicine

## 2024-12-21 VITALS — BP 141/85 | HR 55 | Resp 14 | Ht 66.0 in | Wt 162.0 lb

## 2024-12-21 DIAGNOSIS — M5412 Radiculopathy, cervical region: Secondary | ICD-10-CM | POA: Insufficient documentation

## 2024-12-21 MED ORDER — MELOXICAM 15 MG PO TABS: 15.0000 mg | ORAL_TABLET | Freq: Every day | ORAL | 0 refills | Status: AC

## 2024-12-21 NOTE — Progress Notes (Signed)
 "     Acute visit   Patient: Caroline Bowen   DOB: March 07, 1961   64 y.o. Female  MRN: [id number] PCP: Myrla Jon HERO, MD   Chief Complaint  Patient presents with   Numbness    Pt seen 12/02 for hand numbness/tingling. Returned again 2 weeks ago after finishing 1st round of prednisone . Pt reports not doing anything specific for it to return. She is always doing something, possible aggravated hands from packing/moving.   Subjective    Discussed the use of AI scribe software for clinical note transcription with the patient, who gave verbal consent to proceed.  History of Present Illness   Caroline Bowen is a 64 year old female who presents with recurrent numbness and tingling in the right hand.  She has recurrent numbness and tingling in the right hand that improve on prednisone  but recur when she stops it. Symptoms extend through the hand, disturb her sleep, and limit sleeping on her right side unless she is taking prednisone . They sometimes improve with positional changes, especially when pressure is on the right side, and usually are not bothersome after her morning routine. She occasionally uses extra strength Tylenol but does not use NSAIDs regularly. She is worried symptoms will worsen again after she finishes her current prednisone  course.        Review of Systems  Objective    BP (!) 141/85   Pulse (!) 55   Resp 14   Ht 5' 6 (1.676 m)   Wt 162 lb (73.5 kg)   SpO2 100%   BMI 26.15 kg/m  Physical Exam Vitals reviewed.  Constitutional:      General: She is not in acute distress.    Appearance: Normal appearance. She is well-developed. She is not diaphoretic.  HENT:     Head: Normocephalic and atraumatic.  Eyes:     General: No scleral icterus.    Conjunctiva/sclera: Conjunctivae normal.  Neck:     Thyroid: No thyromegaly.  Cardiovascular:     Rate and Rhythm: Normal rate and regular rhythm.     Pulses: Normal pulses.     Heart sounds: Normal heart sounds. No  murmur heard. Pulmonary:     Effort: Pulmonary effort is normal. No respiratory distress.     Breath sounds: Normal breath sounds. No wheezing, rhonchi or rales.  Musculoskeletal:     Cervical back: Neck supple.     Right lower leg: No edema.     Left lower leg: No edema.     Comments: ROM intact, strength and sensation intact  Skin:    General: Skin is warm and dry.     Findings: No rash.  Neurological:     Mental Status: She is alert and oriented to person, place, and time. Mental status is at baseline.  Psychiatric:        Mood and Affect: Mood normal.        Behavior: Behavior normal.       No results found for any visits on 12/21/24.  Assessment & Plan     Problem List Items Addressed This Visit   None Visit Diagnoses       Cervical radiculopathy    -  Primary   Relevant Orders   Ambulatory referral to Orthopedic Surgery   DG Cervical Spine Complete           Cervical radiculopathy Chronic cervical radiculopathy with symptoms of numbness and tingling in the right hand, exacerbated by certain positions and  relieved by prednisone . Symptoms recur after cessation of prednisone , indicating persistent inflammation or nerve compression. Likely originating from the cervical spine, affecting nerves that extend to the arm and hand. Current management with prednisone  provides temporary relief but is not a long-term solution due to potential renal side effects. - Ordered cervical spine x-ray to assess for bony changes. - Referred to orthopedic specialist for further evaluation and management. - Prescribed meloxicam  for symptomatic relief as needed, with a two-week supply. - Advised against long-term use of prednisone  due to potential renal side effects.        Meds ordered this encounter  Medications   meloxicam  (MOBIC ) 15 MG tablet    Sig: Take 1 tablet (15 mg total) by mouth daily.    Dispense:  15 tablet    Refill:  0     Return if symptoms worsen or fail to  improve.      Jon Eva, MD  [facility] [phone] (phone) [phone] (fax)  [facility]  "

## 2024-12-25 ENCOUNTER — Ambulatory Visit: Payer: Self-pay | Admitting: Family Medicine

## 2024-12-28 ENCOUNTER — Telehealth: Payer: Self-pay | Admitting: Family Medicine

## 2024-12-28 ENCOUNTER — Ambulatory Visit

## 2024-12-28 VITALS — BP 151/81 | HR 55 | Ht 66.0 in | Wt 162.0 lb

## 2024-12-28 DIAGNOSIS — M25531 Pain in right wrist: Secondary | ICD-10-CM | POA: Diagnosis not present

## 2024-12-28 DIAGNOSIS — G5601 Carpal tunnel syndrome, right upper limb: Secondary | ICD-10-CM

## 2024-12-28 DIAGNOSIS — I10 Essential (primary) hypertension: Secondary | ICD-10-CM

## 2024-12-28 NOTE — Telephone Encounter (Signed)
 Copied from CRM [id number]. Topic: General - Other >> Dec 28, 2024 12:53 PM Caroline Bowen wrote: Reason for CRM: Patient called in stating her BP 142/85 and 151/81. Feels absolutely fine but wanted patient to make PCP aware of checking. Stated that's what she normally sees. >> Dec 28, 2024  3:47 PM Caroline Bowen wrote: Patient is finished with the prednisone  as of 1/29 or 1/30. Picked up the Meloxicam  but has not taken any of it.  Is back on her regular meds.

## 2024-12-28 NOTE — Telephone Encounter (Signed)
 Copied from CRM [id number]. Topic: General - Other >> Dec 28, 2024 12:53 PM Caroline Bowen wrote: Reason for CRM: Patient called in stating her BP 142/85 and 151/81. Feels absolutely fine but wanted patient to make PCP aware of checking. Stated that's what she normally sees.

## 2024-12-28 NOTE — Telephone Encounter (Signed)
 Add amlodipine  5mg  daily #30 r2

## 2024-12-28 NOTE — Telephone Encounter (Signed)
 The prednisone  and pain can both raise BP.  Wait until off of the prednisone  to reassess. We will have to add a BP med as the Maxzide is at max dose if needed to control. Limit sodium intake also.

## 2024-12-28 NOTE — Progress Notes (Signed)
 "  Office Visit Note   Patient: Caroline Bowen           Date of Birth: 1961-09-16           MRN: [id number] Visit Date: 12/28/2024              Requested by: Myrla Jon HERO, MD [address] [place],  [place] [address] PCP: Myrla Jon HERO, MD   Assessment & Plan: Visit Diagnoses:  1. Carpal tunnel syndrome, right   2. Pain in right wrist     Plan: Natural history and expected course discussed. Questions answered. Rest and ice. Volar wrist splint to be worn nightly. Recommend NSAIDS for pain Will follow up if not improved and will discuss carpal tunnel injection at that time.  Orders:  Orders Placed This Encounter  Procedures   DG Wrist Complete Right     Subjective: Chief Complaint: Right hand numbness and tingling  HPI Patient is a 64 y.o. year old female who presents with numbness and tingling involving the right hand. Onset of the symptoms was several weeks ago. Inciting event: none known. Current symptoms include  numbness and tingilng which wakes her up at night.  Patient has had no prior hand problems. Treatment to date: none.  Objective: Vital Signs: BP (!) 151/81   Pulse (!) 55   Ht 5' 6 (1.676 m)   Wt 73.5 kg   BMI 26.15 kg/m   Physical Exam Gen: Alert, No Acute Distress right hand: Skin intact, no erythema or induration noted. positive Phalen, negative Tinel, and remainder of ipsilateral wrist, hand and finger exam is normal  Imaging: Radiographs personally reviewed by me; reveal mild diffuse degenerative changes of the carpal bones of the right wrist   PMFS History: Patient Active Problem List   Diagnosis Date Noted   Adenomatous polyp of ascending colon 04/16/2023   Stage 2 chronic kidney disease 03/09/2023   Prediabetes 07/22/2021   Hot flashes due to menopause 12/05/2020   Overweight 12/04/2019   Hyperlipidemia 05/29/2019   Menopausal disorder 06/05/2015   History of anemia 05/24/2015   Abnormal finding on breast  imaging 04/16/2010   Essential hypertension 09/06/2008   Headache disorder 09/06/2008   Past Medical History:  Diagnosis Date   Anemia    in the past   Hypertension    Pre-diabetes     Family History  Problem Relation Age of Onset   Hypertension Mother    Cancer Mother        ovarian   Ovarian cancer Mother    Hypertension Father    Diabetes Father    Kidney disease Father    Prostate cancer Father    Heart failure Father    Diabetes Sister    Hypertension Sister    Hypertension Brother    Stroke Brother    Hypertension Sister    Pancreatic cancer Brother    Hypertension Brother    Breast cancer Neg Hx    Colon cancer Neg Hx     Past Surgical History:  Procedure Laterality Date   CATARACT EXTRACTION W/PHACO Left 08/30/2024   Procedure: PHACOEMULSIFICATION, CATARACT, WITH IOL INSERTION 4.02, 00:41.0;  Surgeon: Mittie Gaskin, MD;  Location: [facility];  Service: Ophthalmology;  Laterality: Left;   CATARACT EXTRACTION W/PHACO Right 09/13/2024   Procedure: PHACOEMULSIFICATION, CATARACT, WITH IOL INSERTION 3.67 00:23.4;  Surgeon: Mittie Gaskin, MD;  Location: [facility];  Service: Ophthalmology;  Laterality: Right;   COLONOSCOPY WITH PROPOFOL  N/A 04/16/2023  Procedure: COLONOSCOPY WITH PROPOFOL ;  Surgeon: Unk Corinn Skiff, MD;  Location: [facility];  Service: Gastroenterology;  Laterality: N/A;   CYST EXCISION Right 2001   wrist   wrist surgery  2001   Social History   Occupational History   Not on file  Tobacco Use   Smoking status: Never   Smokeless tobacco: Never  Vaping Use   Vaping status: Never Used  Substance and Sexual Activity   Alcohol use: Yes    Comment: very rare 3 x year   Drug use: Not on file    Comment: in 20's   Sexual activity: Yes    Birth control/protection: Post-menopausal   Current Outpatient Medications  Medication Instructions   ascorbic acid (VITAMIN C) 500 mg, Daily   gabapentin  (NEURONTIN )  300 MG capsule TAKE 1 CAPSULE BY MOUTH THREE TIMES A DAY   meloxicam  (MOBIC ) 15 mg, Oral, Daily   predniSONE  (DELTASONE ) 10 MG tablet Take 60mg  PO daily x1d, then 50mg  daily x1d, then 40mg  daily x1d, then 30mg  daily x1d, then 20mg  daily x1d, then 10mg  daily x1d, then stop   rosuvastatin  (CRESTOR ) 5 mg, Oral, Daily   triamterene -hydrochlorothiazide (MAXZIDE-25) 37.5-25 MG tablet 1 tablet, Oral, Daily   Allergies as of 12/28/2024 - Review Complete 12/21/2024  Allergen Reaction Noted   Metoprolol Swelling 05/24/2015   "

## 2024-12-28 NOTE — Telephone Encounter (Signed)
 Copied from CRM [id number]. Topic: General - Other  >> Dec 28, 2024  3:47 PM Delon DASEN wrote: Patient is finished with the prednisone  as of 1/29 or 1/30. Picked up the Meloxicam  but has not taken any of it.  Is back on her regular meds.

## 2024-12-28 NOTE — Telephone Encounter (Signed)
 Duplicate encounter Closing this one Please see original

## 2024-12-29 MED ORDER — AMLODIPINE BESYLATE 5 MG PO TABS: 5.0000 mg | ORAL_TABLET | Freq: Every day | ORAL | 1 refills | Status: AC

## 2024-12-29 NOTE — Telephone Encounter (Signed)
 Pt advised. Verbalized understanding. Reports she was going to check her blood pressure again and let us  know if the reading was any better but if still high she will be picking up new rx as advised.

## 2024-12-29 NOTE — Addendum Note (Signed)
 Addended by: LILIAN SEVERO RAMAN on: 12/29/2024 08:38 AM   Modules accepted: Orders

## 2025-01-09 ENCOUNTER — Ambulatory Visit: Admitting: Family Medicine

## 2025-01-18 ENCOUNTER — Encounter

## 2025-03-19 ENCOUNTER — Encounter: Admitting: Family Medicine
